# Patient Record
Sex: Male | Born: 1950 | Race: White | Hispanic: No | Marital: Married | Smoking: Former smoker
Health system: Southern US, Community
[De-identification: ages and names within clinical notes are randomized; demographics above are authoritative.]

## PROBLEM LIST (undated history)

## (undated) DIAGNOSIS — M549 Dorsalgia, unspecified: Secondary | ICD-10-CM

## (undated) DIAGNOSIS — I10 Essential (primary) hypertension: Secondary | ICD-10-CM

## (undated) DIAGNOSIS — G4730 Sleep apnea, unspecified: Secondary | ICD-10-CM

## (undated) DIAGNOSIS — K219 Gastro-esophageal reflux disease without esophagitis: Secondary | ICD-10-CM

## (undated) HISTORY — PX: LUMBAR DISC SURGERY: SHX700

## (undated) HISTORY — PX: COLONOSCOPY: SHX174

## (undated) HISTORY — DX: Gastro-esophageal reflux disease without esophagitis: K21.9

## (undated) HISTORY — PX: ROTATOR CUFF REPAIR: SHX139

## (undated) HISTORY — DX: Sleep apnea, unspecified: G47.30

## (undated) HISTORY — PX: WRIST FRACTURE SURGERY: SHX121

## (undated) HISTORY — DX: Dorsalgia, unspecified: M54.9

## (undated) HISTORY — PX: OTHER SURGICAL HISTORY: SHX169

---

## 2015-08-04 ENCOUNTER — Encounter (INDEPENDENT_AMBULATORY_CARE_PROVIDER_SITE_OTHER): Payer: Self-pay | Admitting: *Deleted

## 2015-08-31 ENCOUNTER — Encounter (INDEPENDENT_AMBULATORY_CARE_PROVIDER_SITE_OTHER): Payer: Self-pay

## 2015-08-31 ENCOUNTER — Encounter (INDEPENDENT_AMBULATORY_CARE_PROVIDER_SITE_OTHER): Payer: Self-pay | Admitting: Internal Medicine

## 2015-08-31 ENCOUNTER — Other Ambulatory Visit (INDEPENDENT_AMBULATORY_CARE_PROVIDER_SITE_OTHER): Payer: Self-pay | Admitting: Internal Medicine

## 2015-08-31 ENCOUNTER — Encounter (INDEPENDENT_AMBULATORY_CARE_PROVIDER_SITE_OTHER): Payer: Self-pay | Admitting: *Deleted

## 2015-08-31 ENCOUNTER — Ambulatory Visit (INDEPENDENT_AMBULATORY_CARE_PROVIDER_SITE_OTHER): Payer: BLUE CROSS/BLUE SHIELD | Admitting: Internal Medicine

## 2015-08-31 ENCOUNTER — Telehealth (INDEPENDENT_AMBULATORY_CARE_PROVIDER_SITE_OTHER): Payer: Self-pay | Admitting: Internal Medicine

## 2015-08-31 ENCOUNTER — Other Ambulatory Visit (INDEPENDENT_AMBULATORY_CARE_PROVIDER_SITE_OTHER): Payer: Self-pay | Admitting: *Deleted

## 2015-08-31 VITALS — BP 120/84 | HR 64 | Temp 97.6°F | Ht 68.0 in | Wt 193.6 lb

## 2015-08-31 DIAGNOSIS — K219 Gastro-esophageal reflux disease without esophagitis: Secondary | ICD-10-CM | POA: Diagnosis not present

## 2015-08-31 DIAGNOSIS — M5489 Other dorsalgia: Secondary | ICD-10-CM | POA: Diagnosis not present

## 2015-08-31 DIAGNOSIS — M549 Dorsalgia, unspecified: Secondary | ICD-10-CM | POA: Insufficient documentation

## 2015-08-31 DIAGNOSIS — Z86010 Personal history of colonic polyps: Secondary | ICD-10-CM

## 2015-08-31 DIAGNOSIS — G4730 Sleep apnea, unspecified: Secondary | ICD-10-CM | POA: Diagnosis not present

## 2015-08-31 MED ORDER — ESOMEPRAZOLE MAGNESIUM 40 MG PO CPDR: 40.0000 mg | DELAYED_RELEASE_CAPSULE | Freq: Every day | ORAL | Status: DC

## 2015-08-31 NOTE — Telephone Encounter (Signed)
error 

## 2015-08-31 NOTE — Progress Notes (Addendum)
Subjective:    Patient ID: William Ayers, male    DOB: 09/12/1950, 65 y.o.   MRN: [id number]  HPI Referred by [facility] [place] by Dr. Vicenta Ayers for GERD. He requesting a refill on his Nexium.  He tells me he has GERD. He tells me Dr William Ayers retired. He wants a colonoscopy. His last colonoscopy was 5 yrs ago. He tells me on his 1st colonoscopy he had polyps.  His acid reflux for the most part controlled with the Nexium. Appetite is good. No weight loss. He denies dysphagia. Hx of dysphagia and has undergone multiple EGD/ED's in the past. (see below) Takes Diclofenac BID for chronic back pain.    07/31/2015 total bili 0.5, total protein 6.6, Hep B surface ATGN negative. Hep B Core AB, IGM negative. Hep C AB negative, Hep A AB, IGM negative. AST 29, ALP 79, ALT 34,  10/01/2003 Colonoscopy Dr. Aleene Ayers: Hx of colonic polyp three years ago. Mild diverticulosis sigmoid colon. The sigmoid, descending, transverse, ascending colon and cecum without abnormalities and in specific no recurrent polypoid disease. Cecum well visualized.  04/27/2009 EGD/ED:  Erosive and corrugated esophagus. Lower esophageal ring dilated with a 52 Maloney dilator. Biopsy: Changes of eosinophilic esophagitis are not seen.  02/25/2008 EGD/ED: Lower esophageal ring dilated with a 54 Maloney bougie.  EG junction at 40 cm with a tight lower esophageal ring. No Barrett's or esophageal ring. Good gastric peristalsis.  08/18/2002 EGD/ED: Chronic dysphagia: The esophagus with a hiatus hernia and low esophageal ring. No Barrett's, no reflux changes. Dilated with an 18-20 balloon with minimal bleeding that stopped spontaneously.  Stomach normal except for hiatal hernia. The cardia, fundus, body, antrum, pyloric canal and duodenum bulb area.  07/22/2001 EGD/ED Dr. Aleene Ayers:  Esophagus with a lower esophageal ring above a hiatus hernia. No Barrett's, no actual esophagitis. Dilated with 18, 19, and 2 mm ballon with minimal bleeding  that stopped spontaneously. The remaining stomach was then examined and other than the hiatus hernia was essentially normal with no evidence of ulcer disease or other abnormalities.    Review of Systems Past Medical History  Diagnosis Date  . Sleep apnea   . GERD (gastroesophageal reflux disease)   . Back pain     Past Surgical History  Procedure Laterality Date  . Rotator cuff repair      both shoulders. Left in April 2015, Rt in June 2016.  Marland Kitchen Left inguinal repair       20 yrs or more     No Known Allergies  No current outpatient prescriptions on file prior to visit.   No current facility-administered medications on file prior to visit.  has a current medication list which includes the following prescription(s): vitamin c, diclofenac, esomeprazole, multivitamin, and esomeprazole.  Current Outpatient Prescriptions  Medication Sig Dispense Refill  . Ascorbic Acid (VITAMIN C) 100 MG tablet Take by mouth as needed.    . diclofenac (VOLTAREN) 75 MG EC tablet Take 75 mg by mouth 2 (two) times daily.    Marland Kitchen esomeprazole (NEXIUM) 40 MG capsule Take 40 mg by mouth daily at 12 noon.    . Multiple Vitamin (MULTIVITAMIN) tablet Take 1 tablet by mouth daily.    Marland Kitchen esomeprazole (NEXIUM) 40 MG capsule Take 1 capsule (40 mg total) by mouth daily at 12 noon. 90 capsule 4   No current facility-administered medications for this visit.        Objective:   Physical ExamBlood pressure 120/84, pulse 64, temperature 97.6 F (36.4  C), height  (1.727 m), weight 193 lb 9.6 oz (87.816 kg). Alert and oriented. Skin warm and dry. Oral mucosa is moist.   . Sclera anicteric, conjunctivae is pink. Thyroid not enlarged. No cervical lymphadenopathy. Lungs clear. Heart regular rate and rhythm.  Abdomen is soft. Bowel sounds are positive. No hepatomegaly. No abdominal masses felt. No tenderness.  No edema to lower extremities.          Assessment & Plan:  Hx of colon polyps. Needs surveillance. (Will  get colonoscopy report from Dr. Vangie Ayers office.) GERD controlled at this time with Nexium.  The risks and benefits such as perforation, bleeding, and infection were reviewed with the patient and is agreeable.

## 2015-08-31 NOTE — Patient Instructions (Signed)
Colonoscopy.  The risks and benefits such as perforation, bleeding, and infection were reviewed with the patient and is agreeable. 

## 2015-08-31 NOTE — Telephone Encounter (Signed)
Patient needs trilyte 

## 2015-09-01 MED ORDER — PEG 3350-KCL-NA BICARB-NACL 420 G PO SOLR: 4000.0000 mL | Freq: Once | ORAL | Status: DC

## 2015-09-27 ENCOUNTER — Other Ambulatory Visit (INDEPENDENT_AMBULATORY_CARE_PROVIDER_SITE_OTHER): Payer: Self-pay | Admitting: Internal Medicine

## 2015-09-27 DIAGNOSIS — Z86010 Personal history of colonic polyps: Secondary | ICD-10-CM

## 2015-09-28 ENCOUNTER — Telehealth (INDEPENDENT_AMBULATORY_CARE_PROVIDER_SITE_OTHER): Payer: Self-pay | Admitting: Internal Medicine

## 2015-09-28 MED ORDER — ESOMEPRAZOLE MAGNESIUM 40 MG PO CPDR: 40.0000 mg | DELAYED_RELEASE_CAPSULE | Freq: Every day | ORAL | Status: DC

## 2015-09-28 NOTE — Telephone Encounter (Signed)
Rx sent to his pharmacy

## 2015-09-28 NOTE — Telephone Encounter (Signed)
Mr. Anne HahnWillis called saying he thinks Terri gave him a Rx for Nexium at his last appointment but he can't find it. He actually can't remember if she gave him a Rx or if she was supposed to send it to his pharmacy. Since he doesn't have it, he's wondering [facility] [place]. He believes he's supposed to have a three month supply. He wants the Brand of Nexium, not the generic. Please call the pt if needed.  Pt's ph# [phone] Thank you.

## 2015-09-28 NOTE — Telephone Encounter (Signed)
Please let patient know I sent an Rx to his pharmacy today

## 2015-09-30 ENCOUNTER — Encounter (HOSPITAL_COMMUNITY): Admission: RE | Payer: Self-pay | Source: Ambulatory Visit

## 2015-09-30 ENCOUNTER — Ambulatory Visit (HOSPITAL_COMMUNITY)
Admission: RE | Admit: 2015-09-30 | Payer: BLUE CROSS/BLUE SHIELD | Source: Ambulatory Visit | Admitting: Internal Medicine

## 2015-09-30 SURGERY — COLONOSCOPY
Anesthesia: Moderate Sedation

## 2015-10-06 MED ORDER — SODIUM CHLORIDE 0.9 % IV SOLN: INTRAVENOUS | Status: DC

## 2015-10-07 ENCOUNTER — Ambulatory Visit (HOSPITAL_COMMUNITY)
Admission: RE | Admit: 2015-10-07 | Discharge: 2015-10-07 | Disposition: A | Discharge status: Home / Self Care | Payer: BLUE CROSS/BLUE SHIELD | Source: Ambulatory Visit | Attending: Internal Medicine | Admitting: Internal Medicine

## 2015-10-07 ENCOUNTER — Encounter (HOSPITAL_COMMUNITY)
Admission: RE | Disposition: A | Discharge status: Home / Self Care | Payer: Self-pay | Source: Ambulatory Visit | Attending: Internal Medicine

## 2015-10-07 ENCOUNTER — Encounter (HOSPITAL_COMMUNITY): Payer: Self-pay | Admitting: *Deleted

## 2015-10-07 DIAGNOSIS — Z1211 Encounter for screening for malignant neoplasm of colon: Secondary | ICD-10-CM | POA: Insufficient documentation

## 2015-10-07 DIAGNOSIS — Z87891 Personal history of nicotine dependence: Secondary | ICD-10-CM | POA: Insufficient documentation

## 2015-10-07 DIAGNOSIS — K5730 Diverticulosis of large intestine without perforation or abscess without bleeding: Secondary | ICD-10-CM

## 2015-10-07 DIAGNOSIS — K219 Gastro-esophageal reflux disease without esophagitis: Secondary | ICD-10-CM | POA: Insufficient documentation

## 2015-10-07 DIAGNOSIS — Z791 Long term (current) use of non-steroidal anti-inflammatories (NSAID): Secondary | ICD-10-CM | POA: Diagnosis not present

## 2015-10-07 DIAGNOSIS — Z86010 Personal history of colonic polyps: Secondary | ICD-10-CM | POA: Diagnosis not present

## 2015-10-07 DIAGNOSIS — K644 Residual hemorrhoidal skin tags: Secondary | ICD-10-CM | POA: Insufficient documentation

## 2015-10-07 DIAGNOSIS — Z79899 Other long term (current) drug therapy: Secondary | ICD-10-CM | POA: Insufficient documentation

## 2015-10-07 DIAGNOSIS — G4730 Sleep apnea, unspecified: Secondary | ICD-10-CM | POA: Diagnosis not present

## 2015-10-07 DIAGNOSIS — K648 Other hemorrhoids: Secondary | ICD-10-CM | POA: Diagnosis not present

## 2015-10-07 HISTORY — PX: COLONOSCOPY: SHX5424

## 2015-10-07 LAB — GLUCOSE, CAPILLARY: Glucose-Capillary: 87 mg/dL (ref 65–99)

## 2015-10-07 SURGERY — COLONOSCOPY
Anesthesia: Moderate Sedation

## 2015-10-07 MED ORDER — STERILE WATER FOR IRRIGATION IR SOLN
Status: DC | PRN
  Administered 2015-10-07: 08:00:00

## 2015-10-07 MED ORDER — MIDAZOLAM HCL 5 MG/5ML IJ SOLN
INTRAMUSCULAR | Status: DC | PRN
  Administered 2015-10-07: 1 mg via INTRAVENOUS
  Administered 2015-10-07 (×2): 2 mg via INTRAVENOUS

## 2015-10-07 MED ORDER — SIMETHICONE 40 MG/0.6ML PO SUSP
ORAL | Status: AC
  Filled 2015-10-07: qty 30

## 2015-10-07 MED ORDER — MEPERIDINE HCL 50 MG/ML IJ SOLN
INTRAMUSCULAR | Status: DC | PRN
  Administered 2015-10-07 (×2): 25 mg via INTRAVENOUS

## 2015-10-07 MED ORDER — MIDAZOLAM HCL 5 MG/5ML IJ SOLN
INTRAMUSCULAR | Status: AC
  Filled 2015-10-07: qty 10

## 2015-10-07 MED ORDER — MEPERIDINE HCL 50 MG/ML IJ SOLN
INTRAMUSCULAR | Status: AC
  Filled 2015-10-07: qty 1

## 2015-10-07 MED ORDER — SODIUM CHLORIDE 0.9 % IV SOLN
INTRAVENOUS | Status: DC
  Administered 2015-10-07: 07:00:00 via INTRAVENOUS

## 2015-10-07 NOTE — H&P (Signed)
William FountainSteven Ayers is an 65 y.o. male.   Chief Complaint: Patient is here for colonoscopy. HPI: Patient is 65 year old Caucasian male with history of colonic adenomas but his last exam was in 2005 no polyps were found. He denies abdominal pain change in bowel habits or rectal bleeding. Family history is negative for CRC.  Past Medical History  Diagnosis Date  . Sleep apnea   . GERD (gastroesophageal reflux disease)   . Back pain     Past Surgical History  Procedure Laterality Date  . Rotator cuff repair      both shoulders. Left in April 2015, Rt in June 2016.  Marland Kitchen. Left inguinal repair       20 yrs or more   . Colonoscopy      X 3    History reviewed. No pertinent family history. Social History:  reports that he has quit smoking. His smoking use included Cigarettes. He has a 1 pack-year smoking history. He does not have any smokeless tobacco history on file. He reports that he does not drink alcohol or use illicit drugs.  Allergies: No Known Allergies  Medications Prior to Admission  Medication Sig Dispense Refill  . diclofenac (VOLTAREN) 75 MG EC tablet Take 75 mg by mouth 2 (two) times daily. Reported on 09/20/2015    . esomeprazole (NEXIUM) 40 MG capsule Take 1 capsule (40 mg total) by mouth daily at 12 noon. 90 capsule 3  . fluticasone (FLONASE) 50 MCG/ACT nasal spray Place 2 sprays into both nostrils daily.    . Multiple Vitamin (MULTIVITAMIN) tablet Take 1 tablet by mouth daily.    . polyethylene glycol-electrolytes (NULYTELY/GOLYTELY) 420 g solution Take 4,000 mLs by mouth once. 4000 mL 0  . traMADol (ULTRAM) 50 MG tablet Take 50 mg by mouth daily.    . Ascorbic Acid (VITAMIN C) 100 MG tablet Take by mouth as needed. Reported on 09/20/2015      Results for orders placed or performed during the hospital encounter of 10/07/15 (from the past 48 hour(s))  Glucose, capillary     Status: None   Collection Time: 10/07/15  6:48 AM  Result Value Ref Range   Glucose-Capillary 87 65 - 99  mg/dL   No results found.  ROS  Blood pressure 106/72, pulse 60, temperature 98 F (36.7 C), temperature source Oral, resp. rate 17, height 5\' 7"  (1.702 m), weight 188 lb (85.276 kg), SpO2 93 %. Physical Exam  Constitutional: He appears well-developed and well-nourished.  HENT:  Mouth/Throat: Oropharynx is clear and moist.  Eyes: Conjunctivae are normal.  Neck: No thyromegaly present.  Cardiovascular: Normal rate, regular rhythm and normal heart sounds.   No murmur heard. Respiratory: Effort normal and breath sounds normal.  GI: Soft. He exhibits no distension. There is no tenderness.  Musculoskeletal: He exhibits no edema.  Lymphadenopathy:    He has no cervical adenopathy.  Neurological: He is alert.  Skin: Skin is warm and dry.     Assessment/Plan History of colonic polyps. Surveillance colonoscopy.  Malissa HippoEHMAN,NAJEEB U, MD 10/07/2015, 7:35 AM

## 2015-10-07 NOTE — Discharge Instructions (Signed)
Resume usual medications and high fiber diet. No driving for 24 hours. Can wait 7 years before next colonoscopy.  Colonoscopy, Care After Refer to this sheet in the next few weeks. These instructions provide you with information on caring for yourself after your procedure. Your health care provider may also give you more specific instructions. Your treatment has been planned according to current medical practices, but problems sometimes occur. Call your health care provider if you have any problems or questions after your procedure. WHAT TO EXPECT AFTER THE PROCEDURE  After your procedure, it is typical to have the following:  A small amount of blood in your stool.  Moderate amounts of gas and mild abdominal cramping or bloating. HOME CARE INSTRUCTIONS  Do not drive, operate machinery, or sign important documents for 24 hours.  You may shower and resume your regular physical activities, but move at a slower pace for the first 24 hours.  Take frequent rest periods for the first 24 hours.  Walk around or put a warm pack on your abdomen to help reduce abdominal cramping and bloating.  Drink enough fluids to keep your urine clear or pale yellow.  You may resume your normal diet as instructed by your health care provider. Avoid heavy or fried foods that are hard to digest.  Avoid drinking alcohol for 24 hours or as instructed by your health care provider.  Only take over-the-counter or prescription medicines as directed by your health care provider.  If a tissue sample (biopsy) was taken during your procedure:  Do not take aspirin or blood thinners for 7 days, or as instructed by your health care provider.  Do not drink alcohol for 7 days, or as instructed by your health care provider.  Eat soft foods for the first 24 hours. SEEK MEDICAL CARE IF: You have persistent spotting of blood in your stool 2-3 days after the procedure. SEEK IMMEDIATE MEDICAL CARE IF:  You have more than a  small spotting of blood in your stool.  You pass large blood clots in your stool.  Your abdomen is swollen (distended).  You have nausea or vomiting.  You have a fever.  You have increasing abdominal pain that is not relieved with medicine.   This information is not intended to replace advice given to you by your health care provider. Make sure you discuss any questions you have with your health care provider.   Document Released: 02/15/2004 Document Revised: 04/23/2013 Document Reviewed: 03/10/2013 Elsevier Interactive Patient Education 2016 Elsevier Inc.  High-Fiber Diet Fiber, also called dietary fiber, is a type of carbohydrate found in fruits, vegetables, whole grains, and beans. A high-fiber diet can have many health benefits. Your health care provider may recommend a high-fiber diet to help:  Prevent constipation. Fiber can make your bowel movements more regular.  Lower your cholesterol.  Relieve hemorrhoids, uncomplicated diverticulosis, or irritable bowel syndrome.  Prevent overeating as part of a weight-loss plan.  Prevent heart disease, type 2 diabetes, and certain cancers. WHAT IS MY PLAN? The recommended daily intake of fiber includes:  38 grams for men under age 64.  30 grams for men over age 64.  25 grams for women under age 64.  21 grams for women over age 850. You can get the recommended daily intake of dietary fiber by eating a variety of fruits, vegetables, grains, and beans. Your health care provider may also recommend a fiber supplement if it is not possible to get enough fiber through your diet. WHAT  DO I NEED TO KNOW ABOUT A HIGH-FIBER DIET?  Fiber supplements have not been widely studied for their effectiveness, so it is better to get fiber through food sources.  Always check the fiber content on thenutrition facts label of any prepackaged food. Look for foods that contain at least 5 grams of fiber per serving.  Ask your dietitian if you have  questions about specific foods that are related to your condition, especially if those foods are not listed in the following section.  Increase your daily fiber consumption gradually. Increasing your intake of dietary fiber too quickly may cause bloating, cramping, or gas.  Drink plenty of water. Water helps you to digest fiber. WHAT FOODS CAN I EAT? Grains Whole-grain breads. Multigrain cereal. Oats and oatmeal. Brown rice. Barley. Bulgur wheat. Gibson. Bran muffins. Popcorn. Rye wafer crackers. Vegetables Sweet potatoes. Spinach. Kale. Artichokes. Cabbage. Broccoli. Green peas. Carrots. Squash. Fruits Berries. Pears. Apples. Oranges. Avocados. Prunes and raisins. Dried figs. Meats and Other Protein Sources Navy, kidney, pinto, and soy beans. Split peas. Lentils. Nuts and seeds. Dairy Fiber-fortified yogurt. Beverages Fiber-fortified soy milk. Fiber-fortified orange juice. Other Fiber bars. The items listed above may not be a complete list of recommended foods or beverages. Contact your dietitian for more options. WHAT FOODS ARE NOT RECOMMENDED? Grains White bread. Pasta made with refined flour. White rice. Vegetables Fried potatoes. Canned vegetables. Well-cooked vegetables.  Fruits Fruit juice. Cooked, strained fruit. Meats and Other Protein Sources Fatty cuts of meat. Fried Sales executive or fried fish. Dairy Milk. Yogurt. Cream cheese. Sour cream. Beverages Soft drinks. Other Cakes and pastries. Butter and oils. The items listed above may not be a complete list of foods and beverages to avoid. Contact your dietitian for more information. WHAT ARE SOME TIPS FOR INCLUDING HIGH-FIBER FOODS IN MY DIET?  Eat a wide variety of high-fiber foods.  Make sure that half of all grains consumed each day are whole grains.  Replace breads and cereals made from refined flour or white flour with whole-grain breads and cereals.  Replace white rice with brown rice, bulgur wheat, or  millet.  Start the day with a breakfast that is high in fiber, such as a cereal that contains at least 5 grams of fiber per serving.  Use beans in place of meat in soups, salads, or pasta.  Eat high-fiber snacks, such as berries, raw vegetables, nuts, or popcorn.   This information is not intended to replace advice given to you by your health care provider. Make sure you discuss any questions you have with your health care provider.   Document Released: 07/03/2005 Document Revised: 07/24/2014 Document Reviewed: 12/16/2013 Elsevier Interactive Patient Education [employer].

## 2015-10-07 NOTE — Op Note (Addendum)
[facility] Patient Name: William Ayers Procedure Date: 10/07/2015 7:26 AM MRN: [id number] Date of Birth: 1951/05/01 Attending MD: Lionel December , MD CSN: [id number] Age: 65 Admit Type: Outpatient Procedure:                Colonoscopy Indications:              High risk colon cancer surveillance: Personal                            history of colonic polyps, Last colonoscopy: 2005 Providers:                Lionel December, MD, Cynda Acres, RN, Birder Robson,                            Technician Referring MD:             Dr. Clementeen Hoof. Alba, DO Medicines:                Meperidine 50 mg IV, Midazolam 5 mg IV Complications:            No immediate complications. Estimated Blood Loss:     Estimated blood loss: none. Procedure:                Pre-Anesthesia Assessment:                           - Prior to the procedure, a History and Physical                            was performed, and patient medications and                            allergies were reviewed. The patient's tolerance of                            previous anesthesia was also reviewed. The risks                            and benefits of the procedure and the sedation                            options and risks were discussed with the patient.                            All questions were answered, and informed consent                            was obtained. Prior Anticoagulants: The patient has                            taken no previous anticoagulant or antiplatelet                            agents. ASA Grade Assessment: I - A normal, healthy  patient. After reviewing the risks and benefits,                            the patient was deemed in satisfactory condition to                            undergo the procedure.                           After obtaining informed consent, the colonoscope                            was passed under direct vision. Throughout the           procedure, the patient's blood pressure, pulse, and                            oxygen saturations were monitored continuously. The                            [id number] ([id number] scope was introduced through                            the anus and advanced to the the cecum, identified                            by appendiceal orifice and ileocecal valve. The                            colonoscopy was performed without difficulty. The                            patient tolerated the procedure well. The quality                            of the bowel preparation was excellent. The                            ileocecal valve, appendiceal orifice, and rectum                            were photographed. Scope In: 7:45:36 AM Scope Out: 7:58:59 AM Scope Withdrawal Time: 0 hours 6 minutes 13 seconds  Total Procedure Duration: 0 hours 13 minutes 23 seconds  Findings:      A few small-mouthed diverticula were found in the sigmoid colon.      Non-bleeding external hemorrhoids were found during retroflexion. The       hemorrhoids were small.      The exam was otherwise without abnormality. Impression:               - Diverticulosis in the sigmoid colon.                           - Non-bleeding external hemorrhoids.                           -  The examination was otherwise normal.                           - No specimens collected. Moderate Sedation:      Moderate (conscious) sedation was administered by the endoscopy nurse       and supervised by the endoscopist. The following parameters were       monitored: oxygen saturation, heart rate, blood pressure, CO2       capnography and response to care. Total physician intraservice time was       21 minutes. Recommendation:           - Patient has a contact number available for                            emergencies. The signs and symptoms of potential                            delayed complications were discussed with the                             patient. Return to normal activities tomorrow.                            Written discharge instructions were provided to the                            patient.                           -                           - High fiber diet today.                           - Continue present medications.                           - Repeat colonoscopy in 7 years for surveillance. Procedure Code(s):        --- Professional ---                           [phone], Colonoscopy, flexible; diagnostic, including                            collection of specimen(s) by brushing or washing,                            when performed (separate procedure)                           G0500, Moderate sedation services provided by the                            same physician or other qualified health care  professional performing a gastrointestinal                            endoscopic service that sedation supports,                            requiring the presence of an independent trained                            observer to assist in the monitoring of the                            patient's level of consciousness and physiological                            status; initial 15 minutes of intra-service time;                            patient age 47 years or older (additional time may                            be reported with 11914, as appropriate) Diagnosis Code(s):        --- Professional ---                           Z86.010, Personal history of colonic polyps                           K64.4, Residual hemorrhoidal skin tags                           K57.30, Diverticulosis of large intestine without                            perforation or abscess without bleeding CPT copyright 2016 American Medical Association. All rights reserved. The codes documented in this report are preliminary and upon coder review may  be revised to meet current compliance requirements. Lionel December, MD Lionel December, MD 10/07/2015 8:12:18 AM This report has been signed electronically. Number of Addenda: 0

## 2015-10-13 ENCOUNTER — Encounter (HOSPITAL_COMMUNITY): Payer: Self-pay | Admitting: Internal Medicine

## 2015-12-01 ENCOUNTER — Telehealth (INDEPENDENT_AMBULATORY_CARE_PROVIDER_SITE_OTHER): Payer: Self-pay | Admitting: Internal Medicine

## 2015-12-01 DIAGNOSIS — K219 Gastro-esophageal reflux disease without esophagitis: Secondary | ICD-10-CM

## 2015-12-01 NOTE — Telephone Encounter (Signed)
Nexium refilled for patient

## 2015-12-20 ENCOUNTER — Other Ambulatory Visit: Payer: Self-pay | Admitting: Orthopedic Surgery

## 2015-12-28 ENCOUNTER — Encounter (HOSPITAL_BASED_OUTPATIENT_CLINIC_OR_DEPARTMENT_OTHER): Payer: Self-pay | Admitting: *Deleted
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
## 2016-01-03 ENCOUNTER — Encounter (HOSPITAL_BASED_OUTPATIENT_CLINIC_OR_DEPARTMENT_OTHER)
Admission: RE | Disposition: A | Discharge status: Home / Self Care | Payer: Self-pay | Source: Ambulatory Visit | Attending: Orthopedic Surgery

## 2016-01-03 ENCOUNTER — Ambulatory Visit (HOSPITAL_COMMUNITY): Payer: BLUE CROSS/BLUE SHIELD

## 2016-01-03 ENCOUNTER — Ambulatory Visit (HOSPITAL_BASED_OUTPATIENT_CLINIC_OR_DEPARTMENT_OTHER): Payer: BLUE CROSS/BLUE SHIELD | Admitting: Anesthesiology

## 2016-01-03 ENCOUNTER — Ambulatory Visit (HOSPITAL_BASED_OUTPATIENT_CLINIC_OR_DEPARTMENT_OTHER)
Admission: RE | Admit: 2016-01-03 | Discharge: 2016-01-03 | Disposition: A | Discharge status: Home / Self Care | Payer: BLUE CROSS/BLUE SHIELD | Source: Ambulatory Visit | Attending: Orthopedic Surgery | Admitting: Orthopedic Surgery

## 2016-01-03 ENCOUNTER — Encounter (HOSPITAL_BASED_OUTPATIENT_CLINIC_OR_DEPARTMENT_OTHER): Payer: Self-pay

## 2016-01-03 DIAGNOSIS — S62012A Displaced fracture of distal pole of navicular [scaphoid] bone of left wrist, initial encounter for closed fracture: Secondary | ICD-10-CM | POA: Insufficient documentation

## 2016-01-03 DIAGNOSIS — S62002K Unspecified fracture of navicular [scaphoid] bone of left wrist, subsequent encounter for fracture with nonunion: Secondary | ICD-10-CM

## 2016-01-03 DIAGNOSIS — Z87891 Personal history of nicotine dependence: Secondary | ICD-10-CM | POA: Diagnosis not present

## 2016-01-03 DIAGNOSIS — X58XXXA Exposure to other specified factors, initial encounter: Secondary | ICD-10-CM | POA: Insufficient documentation

## 2016-01-03 DIAGNOSIS — S62002S Unspecified fracture of navicular [scaphoid] bone of left wrist, sequela: Secondary | ICD-10-CM

## 2016-01-03 DIAGNOSIS — Z4789 Encounter for other orthopedic aftercare: Secondary | ICD-10-CM

## 2016-01-03 HISTORY — PX: OPEN REDUCTION INTERNAL FIXATION (ORIF) SCAPHOID WITH ILIAC CREST BONE GRAFT: SHX5668

## 2016-01-03 SURGERY — OPEN REDUCTION INTERNAL FIXATION (ORIF) SCAPHOID WITH ILIAC CREST BONE GRAFT
Anesthesia: Regional | Site: Wrist | Laterality: Left

## 2016-01-03 MED ORDER — SCOPOLAMINE 1 MG/3DAYS TD PT72: 1.0000 | MEDICATED_PATCH | Freq: Once | TRANSDERMAL | Status: DC | PRN

## 2016-01-03 MED ORDER — MEPERIDINE HCL 25 MG/ML IJ SOLN: 6.2500 mg | INTRAMUSCULAR | Status: DC | PRN

## 2016-01-03 MED ORDER — ONDANSETRON HCL 4 MG PO TABS: 4.0000 mg | ORAL_TABLET | Freq: Three times a day (TID) | ORAL | Status: DC | PRN

## 2016-01-03 MED ORDER — LACTATED RINGERS IV SOLN
INTRAVENOUS | Status: DC
  Administered 2016-01-03: 09:00:00 via INTRAVENOUS

## 2016-01-03 MED ORDER — PROPOFOL 500 MG/50ML IV EMUL
INTRAVENOUS | Status: DC | PRN
  Administered 2016-01-03: 150 ug/kg/min via INTRAVENOUS

## 2016-01-03 MED ORDER — LIDOCAINE HCL (PF) 1 % IJ SOLN
INTRAMUSCULAR | Status: DC | PRN
  Administered 2016-01-03: 8 mL

## 2016-01-03 MED ORDER — HYDROMORPHONE HCL 2 MG PO TABS: 2.0000 mg | ORAL_TABLET | ORAL | Status: DC | PRN

## 2016-01-03 MED ORDER — LACTATED RINGERS IV SOLN
INTRAVENOUS | Status: DC
  Administered 2016-01-03: 08:00:00 via INTRAVENOUS

## 2016-01-03 MED ORDER — HYDROMORPHONE HCL 1 MG/ML IJ SOLN
0.2500 mg | INTRAMUSCULAR | Status: DC | PRN
Start: 2016-01-03 — End: 2016-01-03

## 2016-01-03 MED ORDER — LIDOCAINE HCL (PF) 1 % IJ SOLN
INTRAMUSCULAR | Status: AC
  Filled 2016-01-03: qty 30

## 2016-01-03 MED ORDER — BUPIVACAINE-EPINEPHRINE (PF) 0.5% -1:200000 IJ SOLN
INTRAMUSCULAR | Status: DC | PRN
  Administered 2016-01-03: 30 mL via PERINEURAL

## 2016-01-03 MED ORDER — ONDANSETRON HCL 4 MG/2ML IJ SOLN: 4.0000 mg | Freq: Once | INTRAMUSCULAR | Status: DC | PRN

## 2016-01-03 MED ORDER — 0.9 % SODIUM CHLORIDE (POUR BTL) OPTIME
TOPICAL | Status: DC | PRN
  Administered 2016-01-03: 300 mL

## 2016-01-03 MED ORDER — OXYCODONE-ACETAMINOPHEN 5-325 MG PO TABS: 1.0000 | ORAL_TABLET | ORAL | Status: DC | PRN

## 2016-01-03 MED ORDER — BUPIVACAINE-EPINEPHRINE 0.5% -1:200000 IJ SOLN
INTRAMUSCULAR | Status: DC | PRN
  Administered 2016-01-03: 8 mL

## 2016-01-03 MED ORDER — MIDAZOLAM HCL 2 MG/2ML IJ SOLN
INTRAMUSCULAR | Status: AC
  Filled 2016-01-03: qty 2

## 2016-01-03 MED ORDER — FENTANYL CITRATE (PF) 100 MCG/2ML IJ SOLN
INTRAMUSCULAR | Status: AC
  Filled 2016-01-03: qty 2

## 2016-01-03 MED ORDER — CEFAZOLIN SODIUM-DEXTROSE 2-4 GM/100ML-% IV SOLN
INTRAVENOUS | Status: AC
  Filled 2016-01-03: qty 100

## 2016-01-03 MED ORDER — MIDAZOLAM HCL 2 MG/2ML IJ SOLN
1.0000 mg | INTRAMUSCULAR | Status: DC | PRN
  Administered 2016-01-03: 2 mg via INTRAVENOUS

## 2016-01-03 MED ORDER — LIDOCAINE HCL (CARDIAC) 20 MG/ML IV SOLN
INTRAVENOUS | Status: DC | PRN
  Administered 2016-01-03: 30 mg via INTRAVENOUS

## 2016-01-03 MED ORDER — CEFAZOLIN SODIUM-DEXTROSE 2-4 GM/100ML-% IV SOLN
2.0000 g | INTRAVENOUS | Status: AC
  Administered 2016-01-03: 2 g via INTRAVENOUS

## 2016-01-03 MED ORDER — GLYCOPYRROLATE 0.2 MG/ML IJ SOLN: 0.2000 mg | Freq: Once | INTRAMUSCULAR | Status: DC | PRN

## 2016-01-03 MED ORDER — PROPOFOL 10 MG/ML IV BOLUS
INTRAVENOUS | Status: AC
  Filled 2016-01-03: qty 20

## 2016-01-03 MED ORDER — ONDANSETRON HCL 4 MG/2ML IJ SOLN
INTRAMUSCULAR | Status: AC
  Filled 2016-01-03: qty 2

## 2016-01-03 MED ORDER — FENTANYL CITRATE (PF) 100 MCG/2ML IJ SOLN
50.0000 ug | INTRAMUSCULAR | Status: DC | PRN
  Administered 2016-01-03: 100 ug via INTRAVENOUS

## 2016-01-03 MED ORDER — ONDANSETRON HCL 4 MG/2ML IJ SOLN
INTRAMUSCULAR | Status: DC | PRN
  Administered 2016-01-03: 4 mg via INTRAVENOUS

## 2016-01-03 SURGICAL SUPPLY — 69 items
BANDAGE COBAN STERILE 2 (GAUZE/BANDAGES/DRESSINGS) IMPLANT
BENZOIN TINCTURE PRP APPL 2/3 (GAUZE/BANDAGES/DRESSINGS) ×2 IMPLANT
BIT DRILL MINI LNG ACUTRAK 2 (BIT) ×1 IMPLANT
BLADE MINI RND TIP GREEN BEAV (BLADE) IMPLANT
BLADE SURG 15 STRL LF DISP TIS (BLADE) ×1 IMPLANT
BLADE SURG 15 STRL SS (BLADE) ×1
BNDG COHESIVE 4X5 TAN STRL (GAUZE/BANDAGES/DRESSINGS) ×2 IMPLANT
BNDG ESMARK 4X9 LF (GAUZE/BANDAGES/DRESSINGS) ×2 IMPLANT
BNDG GAUZE ELAST 4 BULKY (GAUZE/BANDAGES/DRESSINGS) ×2 IMPLANT
BRUSH SCRUB EZ PLAIN DRY (MISCELLANEOUS) IMPLANT
CANISTER SUCT 1200ML W/VALVE (MISCELLANEOUS) ×2 IMPLANT
CHLORAPREP W/TINT 26ML (MISCELLANEOUS) ×4 IMPLANT
CORDS BIPOLAR (ELECTRODE) ×2 IMPLANT
COVER BACK TABLE 60X90IN (DRAPES) ×2 IMPLANT
COVER MAYO STAND STRL (DRAPES) ×2 IMPLANT
CUFF TOURNIQUET SINGLE 18IN (TOURNIQUET CUFF) ×2 IMPLANT
CUFF TOURNIQUET SINGLE 24IN (TOURNIQUET CUFF) IMPLANT
DRAPE C-ARM 42X72 X-RAY (DRAPES) ×2 IMPLANT
DRAPE EXTREMITY T 121X128X90 (DRAPE) ×2 IMPLANT
DRAPE INCISE IOBAN 66X45 STRL (DRAPES) ×2 IMPLANT
DRAPE SURG 17X23 STRL (DRAPES) ×2 IMPLANT
DRILL MINI LNG ACUTRAK 2 (BIT) ×2
DRSG ADAPTIC 3X8 NADH LF (GAUZE/BANDAGES/DRESSINGS) ×2 IMPLANT
DRSG EMULSION OIL 3X3 NADH (GAUZE/BANDAGES/DRESSINGS) ×2 IMPLANT
ELECT REM PT RETURN 9FT ADLT (ELECTROSURGICAL) ×2
ELECTRODE REM PT RTRN 9FT ADLT (ELECTROSURGICAL) ×1 IMPLANT
GAUZE SPONGE 4X4 12PLY STRL (GAUZE/BANDAGES/DRESSINGS) ×2 IMPLANT
GLOVE BIO SURGEON STRL SZ7.5 (GLOVE) ×2 IMPLANT
GLOVE BIOGEL PI IND STRL 7.0 (GLOVE) ×1 IMPLANT
GLOVE BIOGEL PI IND STRL 8 (GLOVE) ×1 IMPLANT
GLOVE BIOGEL PI INDICATOR 7.0 (GLOVE) ×1
GLOVE BIOGEL PI INDICATOR 8 (GLOVE) ×1
GLOVE ECLIPSE 6.5 STRL STRAW (GLOVE) ×2 IMPLANT
GOWN STRL REUS W/TWL XL LVL3 (GOWN DISPOSABLE) ×2 IMPLANT
GUIDEWIRE ORTHO MINI ACTK .045 (WIRE) ×4 IMPLANT
NEEDLE 16GX1 1/2 (NEEDLE) IMPLANT
NEEDLE HYPO 25X1 1.5 SAFETY (NEEDLE) ×2 IMPLANT
NS IRRIG 1000ML POUR BTL (IV SOLUTION) ×2 IMPLANT
PACK BASIN DAY SURGERY FS (CUSTOM PROCEDURE TRAY) ×2 IMPLANT
PADDING CAST ABS 4INX4YD NS (CAST SUPPLIES)
PADDING CAST ABS COTTON 4X4 ST (CAST SUPPLIES) IMPLANT
PENCIL BUTTON HOLSTER BLD 10FT (ELECTRODE) ×2 IMPLANT
RUBBERBAND STERILE (MISCELLANEOUS) IMPLANT
SCREW ACUTRAK 2 MINI 24MM (Screw) ×2 IMPLANT
SLEEVE SCD COMPRESS KNEE MED (MISCELLANEOUS) ×2 IMPLANT
SPLINT FAST PLASTER 5X30 (CAST SUPPLIES) ×1
SPLINT PLASTER CAST FAST 5X30 (CAST SUPPLIES) ×1 IMPLANT
SPONGE LAP 4X18 X RAY DECT (DISPOSABLE) ×2 IMPLANT
SPONGE SURGIFOAM ABS GEL 12-7 (HEMOSTASIS) ×2 IMPLANT
STOCKINETTE 6  STRL (DRAPES) ×1
STOCKINETTE 6 STRL (DRAPES) ×1 IMPLANT
SUCTION FRAZIER HANDLE 10FR (MISCELLANEOUS) ×1
SUCTION TUBE FRAZIER 10FR DISP (MISCELLANEOUS) ×1 IMPLANT
SUT VIC AB 0 CT1 27 (SUTURE)
SUT VIC AB 0 CT1 27XBRD ANBCTR (SUTURE) IMPLANT
SUT VIC AB 0 CT3 27 (SUTURE) ×2 IMPLANT
SUT VIC AB 2-0 CT3 27 (SUTURE) ×2 IMPLANT
SUT VIC AB 2-0 SH 27 (SUTURE)
SUT VIC AB 2-0 SH 27XBRD (SUTURE) IMPLANT
SUT VIC AB 3-0 FS2 27 (SUTURE) ×2 IMPLANT
SUT VICRYL 4-0 PS2 18IN ABS (SUTURE) IMPLANT
SUT VICRYL RAPIDE 4-0 (SUTURE) ×2 IMPLANT
SUT VICRYL RAPIDE 4/0 PS 2 (SUTURE) ×2 IMPLANT
SYR BULB 3OZ (MISCELLANEOUS) ×2 IMPLANT
SYRINGE 10CC LL (SYRINGE) ×2 IMPLANT
TOWEL OR 17X24 6PK STRL BLUE (TOWEL DISPOSABLE) ×2 IMPLANT
TOWEL OR NON WOVEN STRL DISP B (DISPOSABLE) ×2 IMPLANT
TUBE CONNECTING 20X1/4 (TUBING) ×2 IMPLANT
UNDERPAD 30X30 (UNDERPADS AND DIAPERS) ×2 IMPLANT

## 2016-01-03 NOTE — Discharge Instructions (Signed)
Discharge Instructions ° ° °You have a dressing with a plaster splint incorporated in it. °Move your fingers as much as possible, making a full fist and fully opening the fist. °Elevate your hand to reduce pain & swelling of the digits.  Ice over the operative site may be helpful to reduce pain & swelling.  DO NOT USE HEAT. °Pain medicine has been prescribed for you.  °Use your medicine as needed over the first 48 hours, and then you can begin to taper your use.  You may use Tylenol in place of your prescribed pain medication, but not IN ADDITION to it. °Leave the dressing in place until you return to our office.  °You may shower, but keep the bandage clean & dry.  °You may drive a car when you are off of prescription pain medications and can safely control your vehicle with both hands. °Our office will call you to arrange follow-up ° ° °Please call [phone] during normal business hours or [phone] after hours for any problems. Including the following: ° °- excessive redness of the incisions °- drainage for more than 4 days °- fever of more than 101.5 F ° °*Please note that pain medications will not be refilled after hours or on weekends. ° ° °Regional Anesthesia Blocks ° °1. Numbness or the inability to move the "blocked" extremity may last from 3-48 hours after placement. The length of time depends on the medication injected and your individual response to the medication. If the numbness is not going away after 48 hours, call your surgeon. ° °2. The extremity that is blocked will need to be protected until the numbness is gone and the  Strength has returned. Because you cannot feel it, you will need to take extra care to avoid injury. Because it may be weak, you may have difficulty moving it or using it. You may not know what position it is in without looking at it while the block is in effect. ° °3. For blocks in the legs and feet, returning to weight bearing and walking needs to be done carefully. You  will need to wait until the numbness is entirely gone and the strength has returned. You should be able to move your leg and foot normally before you try and bear weight or walk. You will need someone to be with you when you first try to ensure you do not fall and possibly risk injury. ° °4. Bruising and tenderness at the needle site are common side effects and will resolve in a few days. ° °5. Persistent numbness or new problems with movement should be communicated to the surgeon or the [facility] ([phone])/ [facility] ([phone]). ° °Post Anesthesia Home Care Instructions ° °Activity: °Get plenty of rest for the remainder of the day. A responsible adult should stay with you for 24 hours following the procedure.  °For the next 24 hours, DO NOT: °-Drive a car °-Operate machinery °-Drink alcoholic beverages °-Take any medication unless instructed by your physician °-Make any legal decisions or sign important papers. ° °Meals: °Start with liquid foods such as gelatin or soup. Progress to regular foods as tolerated. Avoid greasy, spicy, heavy foods. If nausea and/or vomiting occur, drink only clear liquids until the nausea and/or vomiting subsides. Call your physician if vomiting continues. ° °Special Instructions/Symptoms: °Your throat may feel dry or sore from the anesthesia or the breathing tube placed in your throat during surgery. If this causes discomfort, gargle with warm salt water.   The discomfort should disappear within 24 hours. ° °If you had a scopolamine patch placed behind your ear for the management of post- operative nausea and/or vomiting: ° °1. The medication in the patch is effective for 72 hours, after which it should be removed.  Wrap patch in a tissue and discard in the trash. Wash hands thoroughly with soap and water. °2. You may remove the patch earlier than 72 hours if you experience unpleasant side effects which may include dry mouth, dizziness or visual  disturbances. °3. Avoid touching the patch. Wash your hands with soap and water after contact with the patch. °  ° °

## 2016-01-03 NOTE — Anesthesia Procedure Notes (Addendum)
Anesthesia Regional Block:  Supraclavicular block  Pre-Anesthetic Checklist: ,, timeout performed, Correct Patient, Correct Site, Correct Laterality, Correct Procedure, Correct Position, site marked, Risks and benefits discussed,  Surgical consent,  Pre-op evaluation,  At surgeon's request and post-op pain management  Laterality: Left  Prep: chloraprep       Needles:   Needle Type: Echogenic Stimulator Needle     Needle Length: 9cm 9 cm Needle Gauge: 21 and 21 G    Additional Needles:  Procedures: ultrasound guided (picture in chart) and nerve stimulator Supraclavicular block  Nerve Stimulator or Paresthesia:  Response: 0.4 mA,   Additional Responses:   Narrative:  Start time: 01/03/2016 8:40 AM End time: 01/03/2016 8:50 AM Injection made incrementally with aspirations every 5 mL.  Performed by: Personally  Anesthesiologist: Arta BruceSSEY, KEVIN  Additional Notes: Monitors applied. Patient sedated. Sterile prep and drape,hand hygiene and sterile gloves were used. Relevant anatomy identified.Needle position confirmed.Local anesthetic injected incrementally after negative aspiration. Local anesthetic spread visualized around nerve(s). Vascular puncture avoided. No complications. Image printed for medical record.The patient tolerated the procedure well.        Procedure Name: MAC Date/Time: 01/03/2016 9:30 AM Performed by: Cleda ClarksBROWDER, Annye Forrey R Pre-anesthesia Checklist: Patient identified, Emergency Drugs available, Suction available, Patient being monitored and Timeout performed Patient Re-evaluated:Patient Re-evaluated prior to inductionOxygen Delivery Method: Simple face mask

## 2016-01-03 NOTE — Op Note (Signed)
01/03/2016  8:17 AM  PATIENT:  William Ayers  65 y.o. male  PRE-OPERATIVE DIAGNOSIS:  Left scaphoid nonunion  POST-OPERATIVE DIAGNOSIS:  Same  PROCEDURE:  Open treatment of left scaphoid nonunion with iliac crest bone grafting  SURGEON: Cliffton Astersavid A. Janee Mornhompson, MD  PHYSICIAN ASSISTANT: Danielle RankinKirsten Schrader, OPA-C  ANESTHESIA:  local and regional  SPECIMENS:  None  DRAINS:   None  EBL:  less than 50 mL  PREOPERATIVE INDICATIONS:  William Ayers is a  65 y.o. male with left scaphoid nonunion.  The risks benefits and alternatives were discussed with the patient preoperatively including but not limited to the risks of infection, bleeding, nerve injury, cardiopulmonary complications, the need for revision surgery, among others, and the patient verbalized understanding and consented to proceed.  OPERATIVE IMPLANTS: Mini Acutrak screw 1, 22 mm length  OPERATIVE PROCEDURE:  After receiving prophylactic antibiotics and a regional block, the patient was escorted to the operative theatre and placed in a supine position.  A surgical "time-out" was performed during which the planned procedure, proposed operative site, and the correct patient identity were compared to the operative consent and agreement confirmed by the circulating nurse according to current facility policy.  Following application of a tourniquet to the operative extremity, the exposed skin was prepped with Chloraprep and draped in the usual sterile fashion.  Additionally, the site for potential iliac crest bone graft was prepped and the drapes cut over it, covered with Ioban.  The limb was exsanguinated with an Esmarch bandage and the tourniquet inflated to approximately 100mmHg higher than systolic BP.  A volar approach was made to the scaphoid, incising the skin and a hockey stick fashion, subcutaneous tissues with blunt spreading dissection.  The floor of the FCR sheath was exploited.  The volar capsule over the scaphoid and portions of  the trapezium was also incised down the midline of the scaphoid and reflected.  A gentle styloidectomy was performed.  The nonunion site was identified, found to have free motion, and the sites debrided with curettes, and the metaphyseal bone on either side gouged with a K wire that was rotating.  A dorsal K wire was then driven through the radius percutaneously into the lunate was brought out of dorsiflexion.  With the humpback deformity thus taken out of the scaphoid, proximal pole was secured to the capitate temporarily, and the tourniquet released.  The wound was temporarily closed as bone graft was harvested.  The site of bone graft harvest skin incision was anesthetized with local anesthetic and then the skin pulled up over the crest.  Bovie much cautery was carried down to the edge of the crest as the top surface was thus exposed.  Osteotomes were used to take the cortical cancellus junk and some additional cancellous bone was taken with a curette.  The wound was irrigated, telephone packed into the defect, and the deep fascia closed over the crest.  Subcutaneous tissues after irrigating were closed with 3-0 Vicryl subcuticular sutures that were interrupted, and ultimately followed by benzoin and Steri-Strips.  Attention was shifted back to the wrist, where again the tourniquet was inflated.  The bone graft was packed into the defect, first with cancellous graft packing the defect tightly and then with a cortical piece reestablishing volar cortical continuity.  Some of the trapezium was nibbled away to allow for better starting point for the Acutrak screw, and the guidepin was placed under fluoroscopic guidance area it was measured to be at 30, so 22 mm screw was  selected.  The path of the screw was overdrilled with the proper cannulated drill and then the screw was placed.  An effort was made to place a screw down the central axis of the scaphoid as best could be done.  Compression was nice, capturing the  cortical piece snugly.  Final images were obtained after the other 2 provisional K wires have been removed, this allowed for better positioning of the wrist for scaphoid views.  The screw was found to be completely within the confines of the scaphoid radiographically.  The wound was irrigated, the capsule closed with 3-0 Vicryl suture, including reapproximation of the radioscaphocapitate ligament.  The floor of the sheath was reapproximated with the same suture followed by running 4-0 Vicryl Rapide horizontal mattress sutures in the skin.  A short arm him spica splint dressing was applied as a dressing was also applied to the iliac crest harvest site.  He was taken to the recovery room in stable condition.  DISPOSITION: He'll be discharged home today with typical instructions, returning in 10-15 days, at which time he should have new x-rays of the left wrist out of the splint to include a scaphoid view and transition to a short arm versus long-arm thumb spica cast with a window for the ultrasound bone stimulator.

## 2016-01-03 NOTE — Interval H&P Note (Signed)
History and Physical Interval Note:  01/03/2016 8:17 AM  William FountainSteven Ayers  has presented today for surgery, with the diagnosis of LEFT SCAPHOID NON UNION S62.022K  The various methods of treatment have been discussed with the patient and family. After consideration of risks, benefits and other options for treatment, the patient has consented to  Procedure(s) with comments: OPEN TREATMENT OF LEFT  SCAPHOID NONUNION WITH POSSIBLE ILIAC CREST BONE GRAFT (Left) - GENERAL ANESTHESIA WITH PRE-OP BLOCK as a surgical intervention .  The patient's history has been reviewed, patient examined, no change in status, stable for surgery.  I have reviewed the patient's chart and labs.  Questions were answered to the patient's satisfaction.     [name] A.

## 2016-01-03 NOTE — Anesthesia Preprocedure Evaluation (Signed)
Anesthesia Evaluation  Patient identified by MRN, date of birth, ID band Patient awake    Reviewed: Allergy & Precautions, NPO status , Patient's Chart, lab work & pertinent test results  Airway Mallampati: I  TM Distance: >3 FB Neck ROM: Full    Dental   Pulmonary former smoker,    Pulmonary exam normal        Cardiovascular Normal cardiovascular exam     Neuro/Psych    GI/Hepatic GERD  Medicated and Controlled,  Endo/Other    Renal/GU      Musculoskeletal   Abdominal   Peds  Hematology   Anesthesia Other Findings   Reproductive/Obstetrics                             Anesthesia Physical Anesthesia Plan  ASA: II  Anesthesia Plan: Regional   Post-op Pain Management:    Induction: Intravenous  Airway Management Planned: Simple Face Mask  Additional Equipment:   Intra-op Plan:   Post-operative Plan:   Informed Consent: I have reviewed the patients History and Physical, chart, labs and discussed the procedure including the risks, benefits and alternatives for the proposed anesthesia with the patient or authorized representative who has indicated his/her understanding and acceptance.     Plan Discussed with: CRNA and Surgeon  Anesthesia Plan Comments:         Anesthesia Quick Evaluation

## 2016-01-03 NOTE — Transfer of Care (Signed)
Immediate Anesthesia Transfer of Care Note  Patient: Trish FountainSteven Plack  Procedure(s) Performed: Procedure(s) with comments: OPEN TREATMENT OF LEFT  SCAPHOID NONUNION WITH ILIAC CREST BONE GRAFT (Left) - GENERAL ANESTHESIA WITH PRE-OP BLOCK  Patient Location: PACU  Anesthesia Type:MAC  Level of Consciousness: awake, alert  and oriented  Airway & Oxygen Therapy: Patient Spontanous Breathing and Patient connected to nasal cannula oxygen  Post-op Assessment: Report given to RN and Post -op Vital signs reviewed and stable  Post vital signs: Reviewed and stable  Last Vitals:  Filed Vitals:   01/03/16 0858 01/03/16 1130  BP:    Pulse: 59 55  Temp:    Resp: 12     Last Pain: There were no vitals filed for this visit.       Complications: No apparent anesthesia complications

## 2016-01-03 NOTE — Progress Notes (Signed)
Assisted Dr. Ossey with left, ultrasound guided, supraclavicular block. Side rails up, monitors on throughout procedure. See vital signs in flow sheet. Tolerated Procedure well. 

## 2016-01-03 NOTE — Anesthesia Postprocedure Evaluation (Signed)
Anesthesia Post Note  Patient: Trish FountainSteven Fulginiti  Procedure(s) Performed: Procedure(s) (LRB): OPEN TREATMENT OF LEFT  SCAPHOID NONUNION WITH ILIAC CREST BONE GRAFT (Left)  Patient location during evaluation: PACU Anesthesia Type: Regional Level of consciousness: awake and alert and patient cooperative Pain management: pain level controlled Vital Signs Assessment: post-procedure vital signs reviewed and stable Respiratory status: spontaneous breathing and respiratory function stable Cardiovascular status: stable Anesthetic complications: no    Last Vitals:  Filed Vitals:   01/03/16 1156 01/03/16 1207  BP:  117/86  Pulse: 51 58  Temp:  36.5 C  Resp: 13 16    Last Pain:  Filed Vitals:   01/03/16 1211  PainSc: 0-No pain                 Abdifatah Colquhoun DAVID

## 2016-01-04 ENCOUNTER — Encounter (HOSPITAL_BASED_OUTPATIENT_CLINIC_OR_DEPARTMENT_OTHER): Payer: Self-pay | Admitting: Orthopedic Surgery

## 2016-03-28 ENCOUNTER — Encounter (INDEPENDENT_AMBULATORY_CARE_PROVIDER_SITE_OTHER): Payer: Self-pay

## 2017-02-06 ENCOUNTER — Other Ambulatory Visit (INDEPENDENT_AMBULATORY_CARE_PROVIDER_SITE_OTHER): Payer: Self-pay | Admitting: Internal Medicine

## 2017-12-10 IMAGING — CR DG WRIST COMPLETE 3+V*L*
4 series · 4 of 4 positions shown · non-contrast
Comparison: None in PACs

CLINICAL DATA: Preoperative examination prior to surgery for
navicular fracture.

EXAM:
LEFT WRIST - COMPLETE 3+ VIEW

[PA (1 of 3)]
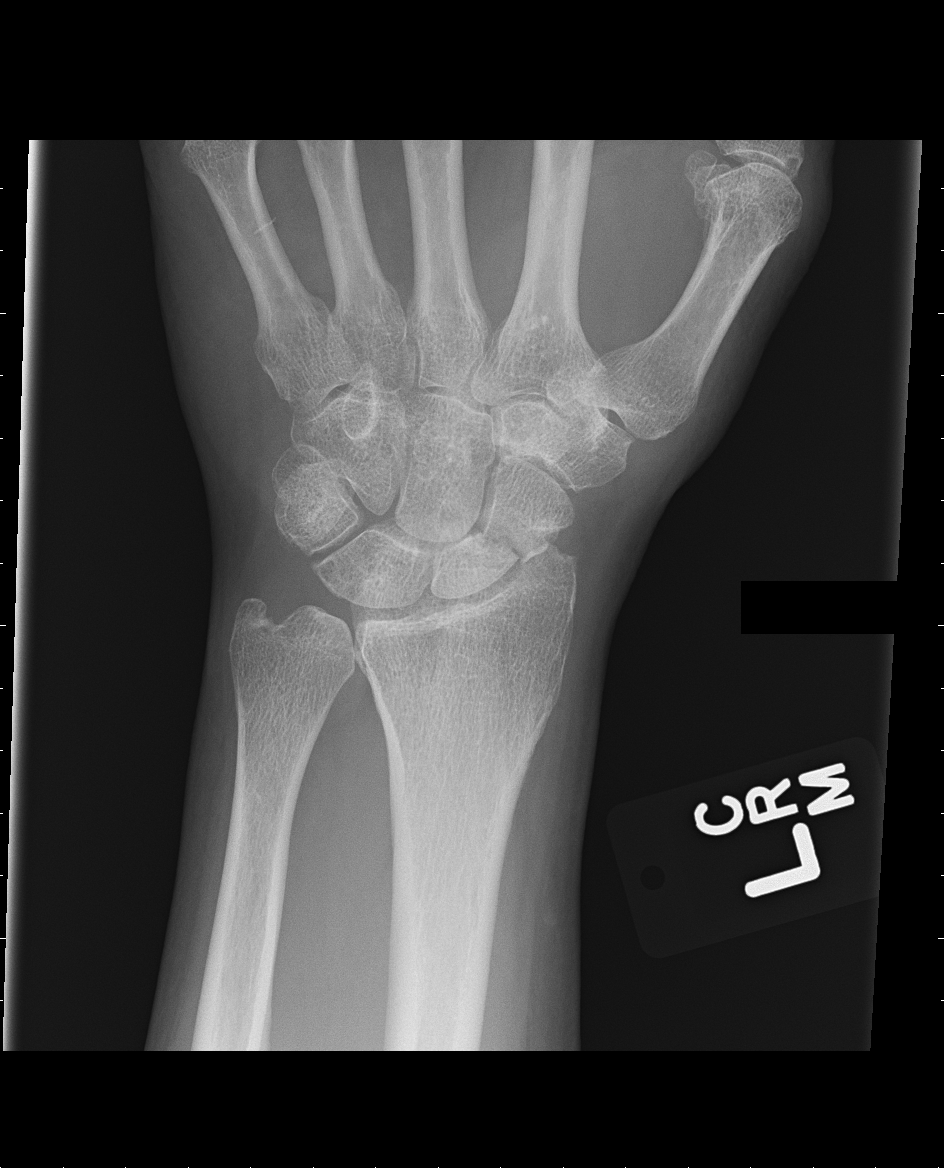

[PA (2 of 3)]
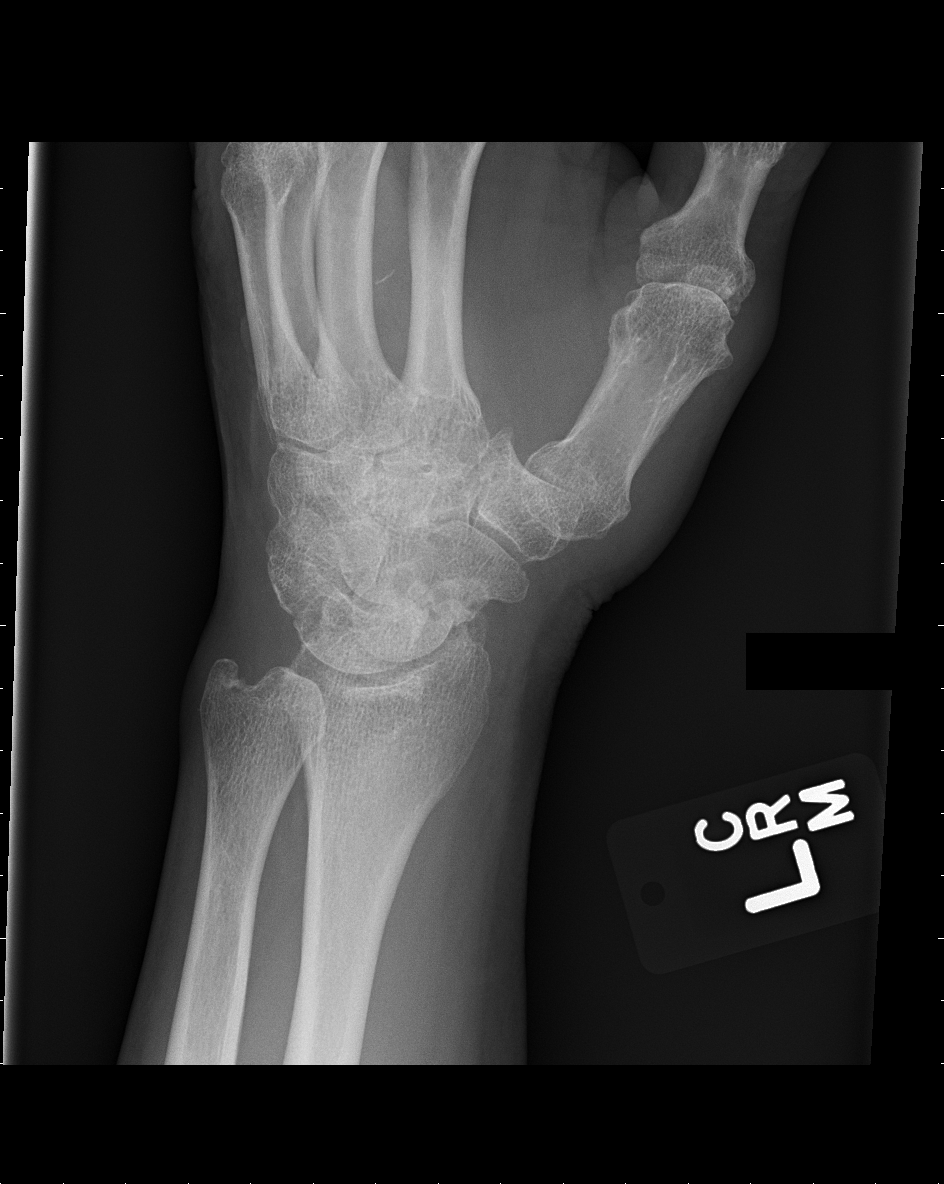

[lat wrist]
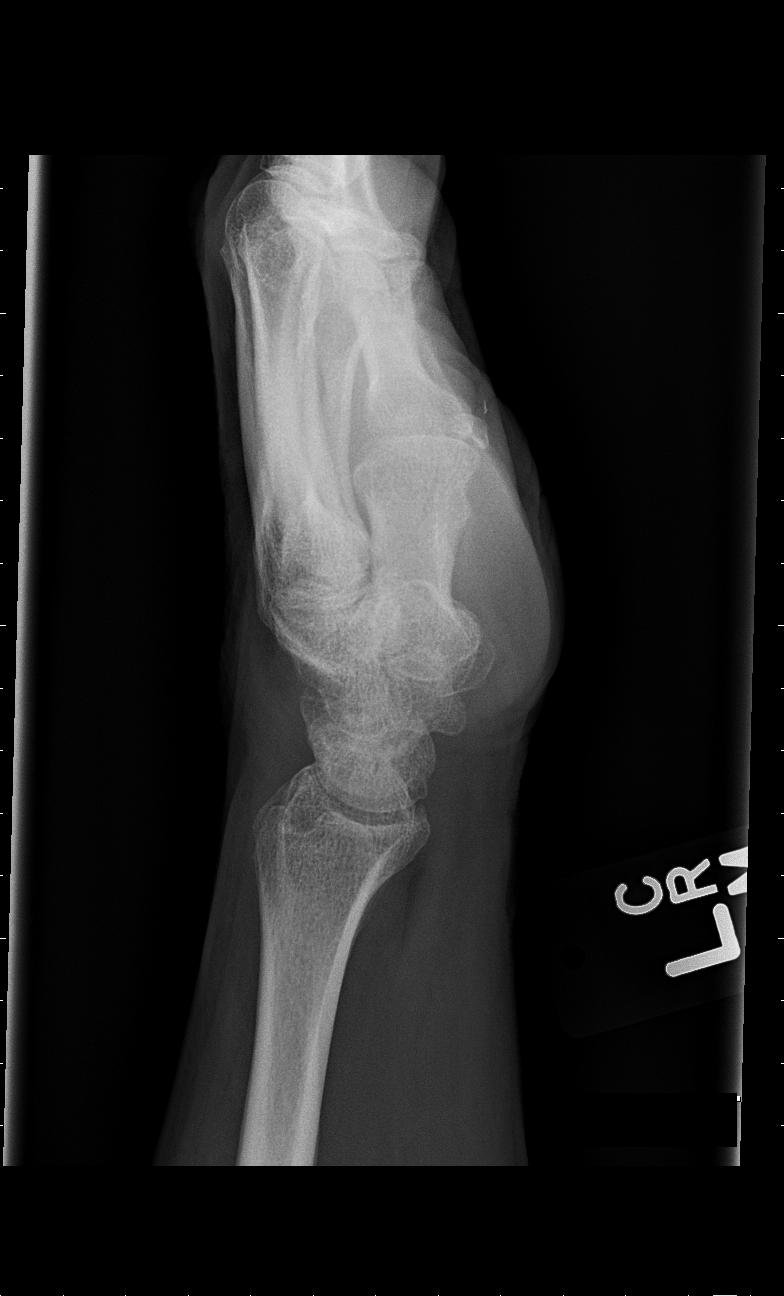

[PA (3 of 3)]
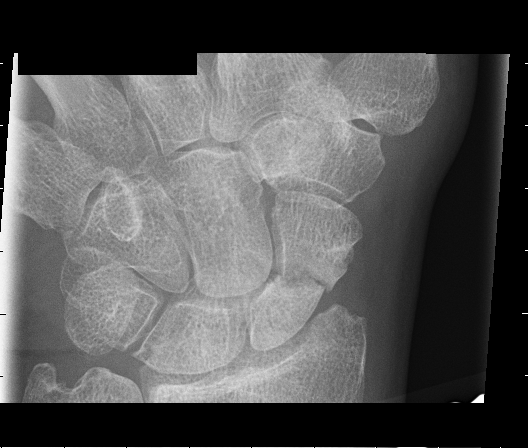

[4 of 4 positions shown; findings below may reference images not displayed]

FINDINGS: There is a mildly displaced fracture through the waist of the
scaphoid. There is some irregularity of the radial styloid but a
definite fracture is not observed. The other carpal bones are
intact. There is very mild narrowing of the radiocarpal joint space.
IMPRESSION: Mildly displaced scaphoid waist fracture. No fracture is observed
elsewhere.

## 2018-04-30 ENCOUNTER — Telehealth (INDEPENDENT_AMBULATORY_CARE_PROVIDER_SITE_OTHER): Payer: Self-pay | Admitting: *Deleted

## 2018-04-30 NOTE — Telephone Encounter (Signed)
Dr.Pomposini 's office called and ask if he could change the Patient's PPI from Esomeprazole to Omeprazole due to the cost. Patient later calls and expresses the same thing , and states that he would like Dr.Pomposini handle this medication and just see Dr.Rehman for problems with GI and for his procedures.  Per TerriSetzer,NP may switch the PPI, Dr.Pomposini's office called and made aware.

## 2020-12-08 ENCOUNTER — Encounter (INDEPENDENT_AMBULATORY_CARE_PROVIDER_SITE_OTHER): Payer: Self-pay | Admitting: *Deleted

## 2021-02-03 ENCOUNTER — Ambulatory Visit (INDEPENDENT_AMBULATORY_CARE_PROVIDER_SITE_OTHER): Payer: BLUE CROSS/BLUE SHIELD | Admitting: Gastroenterology

## 2021-04-21 ENCOUNTER — Other Ambulatory Visit: Payer: Self-pay

## 2021-04-21 ENCOUNTER — Ambulatory Visit (INDEPENDENT_AMBULATORY_CARE_PROVIDER_SITE_OTHER): Payer: Medicare Other | Admitting: Gastroenterology

## 2021-04-21 ENCOUNTER — Encounter (INDEPENDENT_AMBULATORY_CARE_PROVIDER_SITE_OTHER): Payer: Self-pay

## 2021-04-21 ENCOUNTER — Other Ambulatory Visit (INDEPENDENT_AMBULATORY_CARE_PROVIDER_SITE_OTHER): Payer: Self-pay

## 2021-04-21 ENCOUNTER — Encounter (INDEPENDENT_AMBULATORY_CARE_PROVIDER_SITE_OTHER): Payer: Self-pay | Admitting: Gastroenterology

## 2021-04-21 VITALS — BP 123/80 | HR 69 | Temp 98.0°F | Ht 67.0 in | Wt 204.2 lb

## 2021-04-21 DIAGNOSIS — R1319 Other dysphagia: Secondary | ICD-10-CM

## 2021-04-21 DIAGNOSIS — R1310 Dysphagia, unspecified: Secondary | ICD-10-CM | POA: Insufficient documentation

## 2021-04-21 DIAGNOSIS — R110 Nausea: Secondary | ICD-10-CM

## 2021-04-21 DIAGNOSIS — K219 Gastro-esophageal reflux disease without esophagitis: Secondary | ICD-10-CM | POA: Diagnosis not present

## 2021-04-21 NOTE — H&P (View-Only) (Signed)
Jaselyn Nahm Castaneda, M.D. Gastroenterology & Hepatology [facility] [address] [place], [place] [address] Primary Care Physician: Pomposini, Alisse Tuite L, MD No address on file  Referring MD: PCP  Chief Complaint: Dysphagia, GERD and nausea  History of Present Illness: William Ayers is a 69 y.o. 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  Patient reports that for the last two years he has presented recurrent dysphagia. He has presented worsening symptoms recently, presenting an episode every 2-3 days when eating meat or food like rice. He does not have any dysphagia with liquids, He has not thrown up the food but a few times he had to cough the food as he could not breath well. No episodes of food impaction.  He reports he takes omeprazole (believes 40 mg qday). Usually takes the omeprazole after having breakfast. Has an episode of heartburn every 2-3 weeks, for which he takes an antacid for breakthrough episodes. Used to be on Nexium which worked better but it was no longer covered by his current insurance. No odynophagia.  Also reports for the last 3-4 months he has fluctuating nausea without vomiting. He reports it usually happens after eating a few bites of food, usually when he drinks carbonated drinks.  The patient denies having any fever, chills, hematochezia, melena, hematemesis, abdominal distention, abdominal pain, diarrhea, jaundice, pruritus or weight loss.  Last EGD:2010, erosive esophagitis and corrugated esophagus, there was an esophageal ring dilated with Maloney up to [address] [place] Last Colonoscopy:2017 - diverticulosis, hemorrhoids. Recommended to repeat in 7 years  FHx: neg for any gastrointestinal/liver disease, mother breast cancer Social: neg smoking, alcohol or illicit drug use Surgical: no abdominal surgeries  Past Medical History: Past  Medical History:  Diagnosis Date   Back pain    GERD (gastroesophageal reflux disease)    Sleep apnea     Past Surgical History: Past Surgical History:  Procedure Laterality Date   COLONOSCOPY     X 3   COLONOSCOPY N/A 10/07/2015   Procedure: COLONOSCOPY;  Surgeon: Najeeb U Rehman, MD;  [facility]: [facility];  Service: Endoscopy;  Laterality: N/A;  730   Left inguinal repair      20 yrs or more    OPEN REDUCTION INTERNAL FIXATION (ORIF) SCAPHOID WITH ILIAC CREST BONE GRAFT Left 01/03/2016   Procedure: OPEN TREATMENT OF LEFT  SCAPHOID NONUNION WITH ILIAC CREST BONE GRAFT;  Surgeon: David Thompson, MD;  Location: [facility];  Service: Orthopedics;  Laterality: Left;  GENERAL ANESTHESIA WITH PRE-OP BLOCK   ROTATOR CUFF REPAIR     both shoulders. Left in April 2015, Rt in June 2016.    Family History:History reviewed. No pertinent family history.  Social History: Social History   Tobacco Use  Smoking Status Former   Packs/day: 0.25   Years: 4.00   Pack years: 1.00   Types: Cigarettes  Smokeless Tobacco Former  Tobacco Comments   quit 40 yrs ago   Social History   Substance and Sexual Activity  Alcohol Use No   Alcohol/week: 0.0 standard drinks   Social History   Substance and Sexual Activity  Drug Use No    Allergies: No Known Allergies  Medications: Current Outpatient Medications  Medication Sig Dispense Refill   atorvastatin (LIPITOR) 20 MG tablet atorvastatin 20 mg tablet  TAKE 1 TABLET BY MOUTH EVERY DAY     esomeprazole (NEXIUM) 40 MG capsule TAKE 1   CAPSULE 30 MINUTES BEFORE BREAKFAST 90 capsule 4   KRILL OIL PO Take by mouth. Daily     lisinopril (ZESTRIL) 10 MG tablet lisinopril 10 mg tablet  TAKE 1 TABLET BY MOUTH EVERY DAY     meloxicam (MOBIC) 15 MG tablet meloxicam 15 mg tablet     Multiple Vitamin (MULTIVITAMIN) tablet Take 1 tablet by mouth daily.     No current facility-administered medications for this visit.    Review of  Systems: GENERAL: negative for malaise, night sweats HEENT: No changes in hearing or vision, no nose bleeds or other nasal problems. NECK: Negative for lumps, goiter, pain and significant neck swelling RESPIRATORY: Negative for cough, wheezing CARDIOVASCULAR: Negative for chest pain, leg swelling, palpitations, orthopnea GI: SEE HPI MUSCULOSKELETAL: Negative for joint pain or swelling, back pain, and muscle pain. SKIN: Negative for lesions, rash PSYCH: Negative for sleep disturbance, mood disorder and recent psychosocial stressors. HEMATOLOGY Negative for prolonged bleeding, bruising easily, and swollen nodes. ENDOCRINE: Negative for cold or heat intolerance, polyuria, polydipsia and goiter. NEURO: negative for tremor, gait imbalance, syncope and seizures. The remainder of the review of systems is noncontributory.   Physical Exam: BP 123/80 (BP Location: Left Arm, Patient Position: Sitting, Cuff Size: Large)   Pulse 69   Temp 98 F (36.7 C) (Oral)   Ht 5' 7" (1.702 m)   Wt 204 lb 3.2 oz (92.6 kg)   BMI 31.98 kg/m  GENERAL: The patient is AO x3, in no acute distress. HEENT: Head is normocephalic and atraumatic. EOMI are intact. Mouth is well hydrated and without lesions. NECK: Supple. No masses LUNGS: Clear to auscultation. No presence of rhonchi/wheezing/rales. Adequate chest expansion HEART: RRR, normal s1 and s2. ABDOMEN: Soft, nontender, no guarding, no peritoneal signs, and nondistended. BS +. No masses. EXTREMITIES: Without any cyanosis, clubbing, rash, lesions or edema. NEUROLOGIC: AOx3, no focal motor deficit. SKIN: no jaundice, no rashes   Imaging/Labs: as above  I personally reviewed and interpreted the available labs, imaging and endoscopic files.  Impression and Plan: William Ayers is a 69 y.o. 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  Patient has presented relatively acute control  of his GERD with use of omeprazole with occasional breakthrough episodes.  However, he can improve the way in which he takes the medication to maximize the effect of the medicine.  I advised him to take medication at least 30 minutes before his breakfast, but also provided other recommendations to avoid any other episodes of reflux.  He can take some Pepcid as needed if he has breakthrough episodes instead of taking antacids.  He understood and agreed.  However, he has presented recurrent dysphagia and given the previous history of Schatzki's ring, we will need to evaluate this further with an EGD with possible esophageal dilation.  I consider that his nausea could be related to ongoing acid reflux episodes as it also happens more often when he takes carbonated drinks.  We will manage his nausea with his current PPI and evaluate it further with the esophagogastroduodenospy.  Patient understood and agreed.  - Schedule EGD with ED - Continue omeprazole every day - Explained presumed etiology of reflux symptoms. Instruction provided in the use of antireflux medication - patient should take medication in the morning 30-45 minutes before eating breakfast. Discussed avoidance of eating within 2 hours of lying down to sleep and benefit of blocks to elevate head of bed. Also, will   benefit from avoiding carbonated drinks/sodas or food that has tomatoes, spicy or greasy food.  All questions were answered.      Katrinka Blazing, MD Gastroenterology and Hepatology [facility]

## 2021-04-21 NOTE — Progress Notes (Signed)
Katrinka Blazing, M.D. Gastroenterology & Hepatology [facility] [address] [place], [place] [address] Primary Care Physician: Pomposini, Rande Brunt, MD No address on file  Referring MD: PCP  Chief Complaint: Dysphagia, GERD and nausea  History of Present Illness: William Ayers is a 70 y.o. 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  Patient reports that for the last two years he has presented recurrent dysphagia. He has presented worsening symptoms recently, presenting an episode every 2-3 days when eating meat or food like rice. He does not have any dysphagia with liquids, He has not thrown up the food but a few times he had to cough the food as he could not breath well. No episodes of food impaction.  He reports he takes omeprazole (believes 40 mg qday). Usually takes the omeprazole after having breakfast. Has an episode of heartburn every 2-3 weeks, for which he takes an antacid for breakthrough episodes. Used to be on Nexium which worked better but it was no longer covered by his current insurance. No odynophagia.  Also reports for the last 3-4 months he has fluctuating nausea without vomiting. He reports it usually happens after eating a few bites of food, usually when he drinks carbonated drinks.  The patient denies having any fever, chills, hematochezia, melena, hematemesis, abdominal distention, abdominal pain, diarrhea, jaundice, pruritus or weight loss.  Last EGD:2010, erosive esophagitis and corrugated esophagus, there was an esophageal ring dilated with Maloney up to 55 [place] Last Colonoscopy:2017 - diverticulosis, hemorrhoids. Recommended to repeat in 7 years  FHx: neg for any gastrointestinal/liver disease, mother breast cancer Social: neg smoking, alcohol or illicit drug use Surgical: no abdominal surgeries  Past Medical History: Past  Medical History:  Diagnosis Date   Back pain    GERD (gastroesophageal reflux disease)    Sleep apnea     Past Surgical History: Past Surgical History:  Procedure Laterality Date   COLONOSCOPY     X 3   COLONOSCOPY N/A 10/07/2015   Procedure: COLONOSCOPY;  Surgeon: Malissa Hippo, MD;  [facility]: [facility];  Service: Endoscopy;  Laterality: N/A;  730   Left inguinal repair      20 yrs or more    OPEN REDUCTION INTERNAL FIXATION (ORIF) SCAPHOID WITH ILIAC CREST BONE GRAFT Left 01/03/2016   Procedure: OPEN TREATMENT OF LEFT  SCAPHOID NONUNION WITH ILIAC CREST BONE GRAFT;  Surgeon: Mack Hook, MD;  Location: [facility];  Service: Orthopedics;  Laterality: Left;  GENERAL ANESTHESIA WITH PRE-OP BLOCK   ROTATOR CUFF REPAIR     both shoulders. Left in April 2015, Rt in June 2016.    Family History:History reviewed. No pertinent family history.  Social History: Social History   Tobacco Use  Smoking Status Former   Packs/day: 0.25   Years: 4.00   Pack years: 1.00   Types: Cigarettes  Smokeless Tobacco Former  Tobacco Comments   quit 40 yrs ago   Social History   Substance and Sexual Activity  Alcohol Use No   Alcohol/week: 0.0 standard drinks   Social History   Substance and Sexual Activity  Drug Use No    Allergies: No Known Allergies  Medications: Current Outpatient Medications  Medication Sig Dispense Refill   atorvastatin (LIPITOR) 20 MG tablet atorvastatin 20 mg tablet  TAKE 1 TABLET BY MOUTH EVERY DAY     esomeprazole (NEXIUM) 40 MG capsule TAKE 1  CAPSULE 30 MINUTES BEFORE BREAKFAST 90 capsule 4   KRILL OIL PO Take by mouth. Daily     lisinopril (ZESTRIL) 10 MG tablet lisinopril 10 mg tablet  TAKE 1 TABLET BY MOUTH EVERY DAY     meloxicam (MOBIC) 15 MG tablet meloxicam 15 mg tablet     Multiple Vitamin (MULTIVITAMIN) tablet Take 1 tablet by mouth daily.     No current facility-administered medications for this visit.    Review of  Systems: GENERAL: negative for malaise, night sweats HEENT: No changes in hearing or vision, no nose bleeds or other nasal problems. NECK: Negative for lumps, goiter, pain and significant neck swelling RESPIRATORY: Negative for cough, wheezing CARDIOVASCULAR: Negative for chest pain, leg swelling, palpitations, orthopnea GI: SEE HPI MUSCULOSKELETAL: Negative for joint pain or swelling, back pain, and muscle pain. SKIN: Negative for lesions, rash PSYCH: Negative for sleep disturbance, mood disorder and recent psychosocial stressors. HEMATOLOGY Negative for prolonged bleeding, bruising easily, and swollen nodes. ENDOCRINE: Negative for cold or heat intolerance, polyuria, polydipsia and goiter. NEURO: negative for tremor, gait imbalance, syncope and seizures. The remainder of the review of systems is noncontributory.   Physical Exam: BP 123/80 (BP Location: Left Arm, Patient Position: Sitting, Cuff Size: Large)   Pulse 69   Temp 98 F (36.7 C) (Oral)   Ht 5\' 7"  (1.702 m)   Wt 204 lb 3.2 oz (92.6 kg)   BMI 31.98 kg/m  GENERAL: The patient is AO x3, in no acute distress. HEENT: Head is normocephalic and atraumatic. EOMI are intact. Mouth is well hydrated and without lesions. NECK: Supple. No masses LUNGS: Clear to auscultation. No presence of rhonchi/wheezing/rales. Adequate chest expansion HEART: RRR, normal s1 and s2. ABDOMEN: Soft, nontender, no guarding, no peritoneal signs, and nondistended. BS +. No masses. EXTREMITIES: Without any cyanosis, clubbing, rash, lesions or edema. NEUROLOGIC: AOx3, no focal motor deficit. SKIN: no jaundice, no rashes   Imaging/Labs: as above  I personally reviewed and interpreted the available labs, imaging and endoscopic files.  Impression and Plan: William Ayers is a 70 y.o. 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  Patient has presented relatively acute control  of his GERD with use of omeprazole with occasional breakthrough episodes.  However, he can improve the way in which he takes the medication to maximize the effect of the medicine.  I advised him to take medication at least 30 minutes before his breakfast, but also provided other recommendations to avoid any other episodes of reflux.  He can take some Pepcid as needed if he has breakthrough episodes instead of taking antacids.  He understood and agreed.  However, he has presented recurrent dysphagia and given the previous history of Schatzki's ring, we will need to evaluate this further with an EGD with possible esophageal dilation.  I consider that his nausea could be related to ongoing acid reflux episodes as it also happens more often when he takes carbonated drinks.  We will manage his nausea with his current PPI and evaluate it further with the esophagogastroduodenospy.  Patient understood and agreed.  - Schedule EGD with ED - Continue omeprazole every day - Explained presumed etiology of reflux symptoms. Instruction provided in the use of antireflux medication - patient should take medication in the morning 30-45 minutes before eating breakfast. Discussed avoidance of eating within 2 hours of lying down to sleep and benefit of blocks to elevate head of bed. Also, will  benefit from avoiding carbonated drinks/sodas or food that has tomatoes, spicy or greasy food.  All questions were answered.      Katrinka Blazing, MD Gastroenterology and Hepatology [facility]

## 2021-04-21 NOTE — Patient Instructions (Signed)
Schedule EGD with ED Continue omeprazole every day Explained presumed etiology of reflux symptoms. Instruction provided in the use of antireflux medication - patient should take medication in the morning 30-45 minutes before eating breakfast. Discussed avoidance of eating within 2 hours of lying down to sleep and benefit of blocks to elevate head of bed. Also, will benefit from avoiding carbonated drinks/sodas or food that has tomatoes, spicy or greasy food.

## 2021-04-22 ENCOUNTER — Encounter (INDEPENDENT_AMBULATORY_CARE_PROVIDER_SITE_OTHER): Payer: Self-pay

## 2021-05-04 ENCOUNTER — Encounter (HOSPITAL_COMMUNITY)
Admission: RE | Admit: 2021-05-04 | Discharge: 2021-05-04 | Disposition: A | Discharge status: Home / Self Care | Payer: Medicare Other | Source: Ambulatory Visit | Attending: Gastroenterology | Admitting: Gastroenterology

## 2021-05-04 ENCOUNTER — Encounter (HOSPITAL_COMMUNITY): Payer: Self-pay

## 2021-05-04 ENCOUNTER — Other Ambulatory Visit: Payer: Self-pay

## 2021-05-04 VITALS — BP 117/88 | HR 60 | Temp 98.6°F | Resp 18 | Ht 67.0 in | Wt 195.0 lb

## 2021-05-04 DIAGNOSIS — T502X5A Adverse effect of carbonic-anhydrase inhibitors, benzothiadiazides and other diuretics, initial encounter: Secondary | ICD-10-CM | POA: Diagnosis not present

## 2021-05-04 DIAGNOSIS — X58XXXA Exposure to other specified factors, initial encounter: Secondary | ICD-10-CM | POA: Diagnosis not present

## 2021-05-04 DIAGNOSIS — Z01812 Encounter for preprocedural laboratory examination: Secondary | ICD-10-CM | POA: Insufficient documentation

## 2021-05-04 HISTORY — DX: Essential (primary) hypertension: I10

## 2021-05-04 LAB — BASIC METABOLIC PANEL
Anion gap: 6 (ref 5–15)
BUN: 18 mg/dL (ref 8–23)
CO2: 25 mmol/L (ref 22–32)
Calcium: 8.3 mg/dL — ABNORMAL LOW (ref 8.9–10.3)
Chloride: 102 mmol/L (ref 98–111)
Creatinine, Ser: 1.02 mg/dL (ref 0.61–1.24)
GFR, Estimated: >60 mL/min (ref >60)
Glucose, Bld: 84 mg/dL (ref 70–99)
Potassium: 4.1 mmol/L (ref 3.5–5.1)
Sodium: 133 mmol/L — ABNORMAL LOW (ref 135–145)

## 2021-05-04 NOTE — Patient Instructions (Signed)
William Ayers  05/04/2021     @PREFPERIOPPHARMACY @   Your procedure is scheduled on 05/10/2021.  Report to 05/12/2021 at 8:30 A.M.  Call this number if you have problems the morning of surgery:  [phone]   Remember:  Do not eat or drink after midnight.   Please follow the diet and prep instructions given to you by the doctor's office.     Take these medicines the morning of surgery with A SIP OF WATER : Prilosec and Zyrtec    Do not wear jewelry, make-up or nail polish.  Do not wear lotions, powders, or perfumes, or deodorant.  Do not shave 48 hours prior to surgery.  Men may shave face and neck.  Do not bring valuables to the hospital.  [facility] is not responsible for any belongings or valuables.  Contacts, dentures or bridgework may not be worn into surgery.  Leave your suitcase in the car.  After surgery it may be brought to your room.  For patients admitted to the hospital, discharge time will be determined by your treatment team.  Patients discharged the day of surgery will not be allowed to drive home.   Name and phone number of your driver:   Family Special instructions:  n/a  Please read over the following fact sheets that you were given. Care and Recovery After Surgery  Monitored Anesthesia Care Anesthesia refers to techniques, procedures, and medicines that help a person stay safe and comfortable during a medical or dental procedure. Monitored anesthesia care, or sedation, is one type of anesthesia. Your anesthesia specialist may recommend sedation if you will be having a procedure that does not require you to be unconscious. You may have this procedure for: Cataract surgery. A dental procedure. A biopsy. A colonoscopy. During the procedure, you may receive a medicine to help you relax (sedative). There are three levels of sedation: Mild sedation. At this level, you may feel awake and relaxed. You will be able to follow directions. Moderate sedation.  At this level, you will be sleepy. You may not remember the procedure. Deep sedation. At this level, you will be asleep. You will not remember the procedure. The more medicine you are given, the deeper your level of sedation will be. Depending on how you respond to the procedure, the anesthesia specialist may change your level of sedation or the type of anesthesia to fit your needs. An anesthesia specialist will monitor you closely during the procedure. Tell a health care provider about: Any allergies you have. All medicines you are taking, including vitamins, herbs, eye drops, creams, and over-the-counter medicines. Any problems you or family members have had with anesthetic medicines. Any blood disorders you have. Any surgeries you have had. Any medical conditions you have, such as sleep apnea. Whether you are pregnant or may be pregnant. Whether you use cigarettes, alcohol, or drugs. Any use of steroids, whether by mouth or as a cream. What are the risks? Generally, this is a safe procedure. However, problems may occur, including: Getting too much medicine (oversedation). Nausea. Allergic reaction to medicines. Trouble breathing. If this happens, a breathing tube may be used to help with breathing. It will be removed when you are awake and breathing on your own. Heart trouble. Lung trouble. Confusion that gets better with time (emergence delirium). What happens before the procedure? Staying hydrated Follow instructions from your health care provider about hydration, which may include: Up to 2 hours before the procedure - you may continue to  drink clear liquids, such as water, clear fruit juice, black coffee, and plain tea. Eating and drinking restrictions Follow instructions from your health care provider about eating and drinking, which may include: 8 hours before the procedure - stop eating heavy meals or foods, such as meat, fried foods, or fatty foods. 6 hours before the procedure  - stop eating light meals or foods, such as toast or cereal. 6 hours before the procedure - stop drinking milk or drinks that contain milk. 2 hours before the procedure - stop drinking clear liquids. Medicines Ask your health care provider about: Changing or stopping your regular medicines. This is especially important if you are taking diabetes medicines or blood thinners. Taking medicines such as aspirin and ibuprofen. These medicines can thin your blood. Do not take these medicines unless your health care provider tells you to take them. Taking over-the-counter medicines, vitamins, herbs, and supplements. Tests and exams You will have a physical exam. You may have blood tests done to show: How well your kidneys and liver are working. How well your blood can clot. General instructions Plan to have a responsible adult take you home from the hospital or clinic. If you will be going home right after the procedure, plan to have a responsible adult care for you for the time you are told. This is important. What happens during the procedure?  Your blood pressure, heart rate, breathing, level of pain, and overall condition will be monitored. An IV will be inserted into one of your veins. You will be given medicines as needed to keep you comfortable during the procedure. This may mean changing the level of sedation. Depending on your age or the procedure, the sedative may be given: As a pill that you will swallow or as a pill that is inserted into the rectum. As an injection into the vein or muscle. As a spray through the nose. The procedure will be performed. Your breathing, heart rate, and blood pressure will be monitored during the procedure. When the procedure is over, the medicine will be stopped. The procedure may vary among health care providers and hospitals. What happens after the procedure? Your blood pressure, heart rate, breathing rate, and blood oxygen level will be monitored  until you leave the hospital or clinic. You may feel sleepy, clumsy, or nauseous. You may feel forgetful about what happened after the procedure. You may vomit. You may continue to get IV fluids. Do not drive or operate machinery until your health care provider says that it is safe. Summary Monitored anesthesia care is used to keep a patient comfortable during short procedures. Tell your health care provider about any allergies or health conditions you have and about all the medicines you are taking. Before the procedure, follow instructions about when to stop eating and drinking and about changing or stopping any medicines. Your blood pressure, heart rate, breathing rate, and blood oxygen level will be monitored until you leave the hospital or clinic. Plan to have a responsible adult take you home from the hospital or clinic. This information is not intended to replace advice given to you by your health care provider. Make sure you discuss any questions you have with your health care provider. Document Revised: 03/18/2020 Document Reviewed: 06/05/2019 Elsevier Patient Education  2022 [employer] Upper Endoscopy, Adult Upper endoscopy is a procedure to look inside the upper GI (gastrointestinal) tract. The upper GI tract is made up of: The part of the body that moves food from your mouth  to your stomach (esophagus). The stomach. The first part of your small intestine (duodenum). This procedure is also called esophagogastroduodenoscopy (EGD) or gastroscopy. In this procedure, your health care provider passes a thin, flexible tube (endoscope) through your mouth and down your esophagus into your stomach. A small camera is attached to the end of the tube. Images from the camera appear on a monitor in the exam room. During this procedure, your health care provider may also remove a small piece of tissue to be sent to a lab and examined under a microscope (biopsy). Your health care provider may do  an upper endoscopy to diagnose cancers of the upper GI tract. You may also have this procedure to find the cause of other conditions, such as: Stomach pain. Heartburn. Pain or problems when swallowing. Nausea and vomiting. Stomach bleeding. Stomach ulcers. Tell a health care provider about: Any allergies you have. All medicines you are taking, including vitamins, herbs, eye drops, creams, and over-the-counter medicines. Any problems you or family members have had with anesthetic medicines. Any blood disorders you have. Any surgeries you have had. Any medical conditions you have. Whether you are pregnant or may be pregnant. What are the risks? Generally, this is a safe procedure. However, problems may occur, including: Infection. Bleeding. Allergic reactions to medicines. A tear or hole (perforation) in the esophagus, stomach, or duodenum. What happens before the procedure? Staying hydrated Follow instructions from your health care provider about hydration, which may include: Up to 2 hours before the procedure - you may continue to drink clear liquids, such as water, clear fruit juice, black coffee, and plain tea.  Eating and drinking restrictions Follow instructions from your health care provider about eating and drinking, which may include: 8 hours before the procedure - stop eating heavy meals or foods, such as meat, fried foods, or fatty foods. 6 hours before the procedure - stop eating light meals or foods, such as toast or cereal. 6 hours before the procedure - stop drinking milk or drinks that contain milk. 2 hours before the procedure - stop drinking clear liquids. Medicines Ask your health care provider about: Changing or stopping your regular medicines. This is especially important if you are taking diabetes medicines or blood thinners. Taking medicines such as aspirin and ibuprofen. These medicines can thin your blood. Do not take these medicines unless your health care  provider tells you to take them. Taking over-the-counter medicines, vitamins, herbs, and supplements. General instructions Plan to have someone take you home from the hospital or clinic. If you will be going home right after the procedure, plan to have someone with you for 24 hours. Ask your health care provider what steps will be taken to help prevent infection. What happens during the procedure?  An IV will be inserted into one of your veins. You may be given one or more of the following: A medicine to help you relax (sedative). A medicine to numb the throat (local anesthetic). You will lie on your left side on an exam table. Your health care provider will pass the endoscope through your mouth and down your esophagus. Your health care provider will use the scope to check the inside of your esophagus, stomach, and duodenum. Biopsies may be taken. The endoscope will be removed. The procedure may vary among health care providers and hospitals. What happens after the procedure? Your blood pressure, heart rate, breathing rate, and blood oxygen level will be monitored until you leave the hospital or clinic. Do not  drive for 24 hours if you were given a sedative during your procedure. When your throat is no longer numb, you may be given some fluids to drink. It is up to you to get the results of your procedure. Ask your health care provider, or the department that is doing the procedure, when your results will be ready. Summary Upper endoscopy is a procedure to look inside the upper GI tract. During the procedure, an IV will be inserted into one of your veins. You may be given a medicine to help you relax. A medicine will be used to numb your throat. The endoscope will be passed through your mouth and down your esophagus. This information is not intended to replace advice given to you by your health care provider. Make sure you discuss any questions you have with your health care  provider. Document Revised: 12/26/2017 Document Reviewed: 12/03/2017 Elsevier Patient Education  2022 [employer].

## 2021-05-05 ENCOUNTER — Encounter (HOSPITAL_COMMUNITY): Payer: Medicare Other

## 2021-05-10 ENCOUNTER — Ambulatory Visit (HOSPITAL_COMMUNITY): Payer: Medicare Other | Admitting: Anesthesiology

## 2021-05-10 ENCOUNTER — Ambulatory Visit (HOSPITAL_COMMUNITY)
Admission: RE | Admit: 2021-05-10 | Discharge: 2021-05-10 | Disposition: A | Discharge status: Home / Self Care | Payer: Medicare Other | Source: Ambulatory Visit | Attending: Gastroenterology | Admitting: Gastroenterology

## 2021-05-10 ENCOUNTER — Encounter (HOSPITAL_COMMUNITY)
Admission: RE | Disposition: A | Discharge status: Home / Self Care | Payer: Self-pay | Source: Ambulatory Visit | Attending: Gastroenterology

## 2021-05-10 DIAGNOSIS — K317 Polyp of stomach and duodenum: Secondary | ICD-10-CM | POA: Diagnosis not present

## 2021-05-10 DIAGNOSIS — K2980 Duodenitis without bleeding: Secondary | ICD-10-CM | POA: Insufficient documentation

## 2021-05-10 DIAGNOSIS — K3189 Other diseases of stomach and duodenum: Secondary | ICD-10-CM | POA: Insufficient documentation

## 2021-05-10 DIAGNOSIS — R1310 Dysphagia, unspecified: Secondary | ICD-10-CM | POA: Diagnosis present

## 2021-05-10 DIAGNOSIS — Z79899 Other long term (current) drug therapy: Secondary | ICD-10-CM | POA: Diagnosis not present

## 2021-05-10 DIAGNOSIS — Z87891 Personal history of nicotine dependence: Secondary | ICD-10-CM | POA: Insufficient documentation

## 2021-05-10 DIAGNOSIS — K449 Diaphragmatic hernia without obstruction or gangrene: Secondary | ICD-10-CM | POA: Diagnosis not present

## 2021-05-10 DIAGNOSIS — K219 Gastro-esophageal reflux disease without esophagitis: Secondary | ICD-10-CM | POA: Insufficient documentation

## 2021-05-10 DIAGNOSIS — K222 Esophageal obstruction: Secondary | ICD-10-CM | POA: Insufficient documentation

## 2021-05-10 DIAGNOSIS — Z791 Long term (current) use of non-steroidal anti-inflammatories (NSAID): Secondary | ICD-10-CM | POA: Insufficient documentation

## 2021-05-10 HISTORY — PX: POLYPECTOMY: SHX5525

## 2021-05-10 HISTORY — PX: ESOPHAGEAL DILATION: SHX303

## 2021-05-10 HISTORY — PX: ESOPHAGOGASTRODUODENOSCOPY (EGD) WITH PROPOFOL: SHX5813

## 2021-05-10 SURGERY — ESOPHAGOGASTRODUODENOSCOPY (EGD) WITH PROPOFOL
Anesthesia: General

## 2021-05-10 MED ORDER — PROPOFOL 10 MG/ML IV BOLUS
INTRAVENOUS | Status: DC | PRN
  Administered 2021-05-10: 100 ug/kg/min via INTRAVENOUS
  Administered 2021-05-10: 100 mg via INTRAVENOUS

## 2021-05-10 MED ORDER — LIDOCAINE HCL (CARDIAC) PF 100 MG/5ML IV SOSY
PREFILLED_SYRINGE | INTRAVENOUS | Status: DC | PRN
  Administered 2021-05-10: 100 mg via INTRAVENOUS

## 2021-05-10 MED ORDER — LACTATED RINGERS IV SOLN: INTRAVENOUS | Status: DC

## 2021-05-10 NOTE — Discharge Instructions (Addendum)
You are being discharged to home.  Resume your previous diet.  We are waiting for your pathology results.  

## 2021-05-10 NOTE — Op Note (Signed)
[facility] Patient Name: William Ayers Procedure Date: 05/10/2021 9:26 AM MRN: [id number] Date of Birth: March 22, 1951 Attending MD: Katrinka Blazing ,  CSN: [id number] Age: 70 Admit Type: Outpatient Procedure:                Upper GI endoscopy Indications:              Dysphagia Providers:                Katrinka Blazing, Crystal Page, Pandora Leiter,                            Technician Referring MD:              Medicines:                Monitored Anesthesia Care Complications:            No immediate complications. Estimated Blood Loss:     Estimated blood loss: none. Procedure:                Pre-Anesthesia Assessment:                           - Prior to the procedure, a History and Physical                            was performed, and patient medications, allergies                            and sensitivities were reviewed. The patient's                            tolerance of previous anesthesia was reviewed.                           - The risks and benefits of the procedure and the                            sedation options and risks were discussed with the                            patient. All questions were answered and informed                            consent was obtained.                           - ASA Grade Assessment: II - A patient with mild                            systemic disease.                           After obtaining informed consent, the endoscope was                            passed under direct vision. Throughout the  procedure, the patient's blood pressure, pulse, and                            oxygen saturations were monitored continuously. The                            GIF-H190 ([id number]) scope was introduced through the                            mouth, and advanced to the second part of duodenum.                            The upper GI endoscopy was accomplished without                            difficulty.  The patient tolerated the procedure                            well. Scope In: 9:38:22 AM Scope Out: 9:47:43 AM Total Procedure Duration: 0 hours 9 minutes 21 seconds  Findings:      A 2 cm hiatal hernia was present.      No endoscopic abnormality was evident in the esophagus to explain the       patient's complaint of dysphagia. It was decided, however, to proceed       with dilation of the entire esophagus. A guidewire was placed and the       scope was withdrawn. Dilation was performed with a Savary dilator with       mild resistance at 18 mm. There was presence of mucosal disruption upon       reinspection.      Multiple 3 to 8 mm sessile fundic gland polyps with no bleeding were       found in the gastric fundus and in the gastric body.      A single 3 mm mucosal nodule with a localized distribution was found in       the duodenal bulb. Biopsies were taken with a cold forceps for histology.      The exam of the duodenum was otherwise normal. Impression:               - 2 cm hiatal hernia.                           - No endoscopic esophageal abnormality to explain                            patient's dysphagia. Esophagus dilated. Dilated.                           - Multiple fundic gland polyps.                           - Mucosal nodule found in the duodenum. Biopsied. Moderate Sedation:      Per Anesthesia Care Recommendation:           - Discharge patient to home (ambulatory).                           -  Resume previous diet.                           - Await pathology results. Procedure Code(s):        --- Professional ---                           [phone], Esophagogastroduodenoscopy, flexible,                            transoral; with insertion of guide wire followed by                            passage of dilator(s) through esophagus over guide                            wire                           43239, 59, Esophagogastroduodenoscopy, flexible,                             transoral; with biopsy, single or multiple Diagnosis Code(s):        --- Professional ---                           K44.9, Diaphragmatic hernia without obstruction or                            gangrene                           R13.10, Dysphagia, unspecified                           K31.7, Polyp of stomach and duodenum                           K31.89, Other diseases of stomach and duodenum CPT copyright 2019 American Medical Association. All rights reserved. The codes documented in this report are preliminary and upon coder review may  be revised to meet current compliance requirements. Katrinka Blazing, MD Katrinka Blazing,  05/10/2021 9:53:54 AM This report has been signed electronically. Number of Addenda: 0

## 2021-05-10 NOTE — Transfer of Care (Signed)
Immediate Anesthesia Transfer of Care Note  Patient: William Ayers  Procedure(s) Performed: ESOPHAGOGASTRODUODENOSCOPY (EGD) WITH PROPOFOL ESOPHAGEAL DILATION POLYPECTOMY  Patient Location: Short Stay  Anesthesia Type:General  Level of Consciousness: awake, alert , oriented, patient cooperative and responds to stimulation  Airway & Oxygen Therapy: Patient Spontanous Breathing  Post-op Assessment: Report given to RN, Post -op Vital signs reviewed and stable and Patient moving all extremities X 4  Post vital signs: Reviewed and stable  Last Vitals:  Vitals Value Taken Time  BP    Temp    Pulse    Resp    SpO2      Last Pain:  Vitals:   05/10/21 0935  TempSrc:   PainSc: 2       Patients Stated Pain Goal: 5 (05/10/21 0831)  Complications: No notable events documented.

## 2021-05-10 NOTE — Interval H&P Note (Signed)
History and Physical Interval Note:  05/10/2021 9:33 AM [name] is a 70 y.o. 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   BP 114/72   Pulse (!) 56   Temp 98.2 F (36.8 C) (Oral)   Resp 18   SpO2 96%  GENERAL: The patient is AO x3, in no acute distress. HEENT: Head is normocephalic and atraumatic. EOMI are intact. Mouth is well hydrated and without lesions. NECK: Supple. No masses LUNGS: Clear to auscultation. No presence of rhonchi/wheezing/rales. Adequate chest expansion HEART: RRR, normal s1 and s2. ABDOMEN: Soft, nontender, no guarding, no peritoneal signs, and nondistended. BS +. No masses. EXTREMITIES: Without any cyanosis, clubbing, rash, lesions or edema. NEUROLOGIC: AOx3, no focal motor deficit. SKIN: no jaundice, no rashes  William Ayers  has presented today for surgery, with the diagnosis of Dypshagia GERD.  The various methods of treatment have been discussed with the patient and family. After consideration of risks, benefits and other options for treatment, the patient has consented to  Procedure(s) with comments: ESOPHAGOGASTRODUODENOSCOPY (EGD) WITH PROPOFOL (N/A) - 9:50 ESOPHAGEAL DILATION (N/A) as a surgical intervention.  The patient's history has been reviewed, patient examined, no change in status, stable for surgery.  I have reviewed the patient's chart and labs.  Questions were answered to the patient's satisfaction.     [name]

## 2021-05-10 NOTE — Anesthesia Preprocedure Evaluation (Addendum)
Anesthesia Evaluation  Patient identified by MRN, date of birth, ID band Patient awake    Reviewed: Allergy & Precautions, NPO status , Patient's Chart, lab work & pertinent test results  Airway Mallampati: II  TM Distance: >3 FB Neck ROM: Full    Dental  (+) Dental Advisory Given, Caps   Pulmonary sleep apnea , former smoker,    Pulmonary exam normal breath sounds clear to auscultation       Cardiovascular Exercise Tolerance: Good hypertension, Pt. on medications Normal cardiovascular exam Rhythm:Regular Rate:Normal     Neuro/Psych negative neurological ROS  negative psych ROS   GI/Hepatic Neg liver ROS, GERD  Medicated and Controlled,  Endo/Other  negative endocrine ROS  Renal/GU negative Renal ROS  negative genitourinary   Musculoskeletal negative musculoskeletal ROS (+)   Abdominal   Peds negative pediatric ROS (+)  Hematology negative hematology ROS (+)   Anesthesia Other Findings   Reproductive/Obstetrics negative OB ROS                             Anesthesia Physical Anesthesia Plan  ASA: 2  Anesthesia Plan: General   Post-op Pain Management:    Induction: Intravenous  PONV Risk Score and Plan: TIVA  Airway Management Planned: Nasal Cannula and Natural Airway  Additional Equipment:   Intra-op Plan:   Post-operative Plan:   Informed Consent: I have reviewed the patients History and Physical, chart, labs and discussed the procedure including the risks, benefits and alternatives for the proposed anesthesia with the patient or authorized representative who has indicated his/her understanding and acceptance.     Dental advisory given  Plan Discussed with: CRNA and Surgeon  Anesthesia Plan Comments:         Anesthesia Quick Evaluation

## 2021-05-10 NOTE — Anesthesia Postprocedure Evaluation (Signed)
Anesthesia Post Note  Patient: William Ayers  Procedure(s) Performed: ESOPHAGOGASTRODUODENOSCOPY (EGD) WITH PROPOFOL ESOPHAGEAL DILATION POLYPECTOMY  Patient location during evaluation: Phase II Anesthesia Type: General Level of consciousness: awake and alert Pain management: pain level controlled Vital Signs Assessment: post-procedure vital signs reviewed and stable Respiratory status: spontaneous breathing, nonlabored ventilation and respiratory function stable Cardiovascular status: blood pressure returned to baseline and stable Postop Assessment: no apparent nausea or vomiting Anesthetic complications: no   No notable events documented.   Last Vitals:  Vitals:   05/10/21 0831 05/10/21 0953  BP: 114/72 (!) 100/53  Pulse: (!) 56 (!) 55  Resp: 18 14  Temp: 36.8 C 36.7 C  SpO2: 96% 98%    Last Pain:  Vitals:   05/10/21 0953  TempSrc: Oral  PainSc: 0-No pain                 Shunte Senseney C Yulanda Diggs

## 2021-05-11 LAB — SURGICAL PATHOLOGY

## 2021-05-12 ENCOUNTER — Encounter (HOSPITAL_COMMUNITY): Payer: Self-pay | Admitting: Gastroenterology

## 2022-09-06 ENCOUNTER — Encounter (INDEPENDENT_AMBULATORY_CARE_PROVIDER_SITE_OTHER): Payer: Self-pay | Admitting: *Deleted

## 2022-09-15 ENCOUNTER — Telehealth (INDEPENDENT_AMBULATORY_CARE_PROVIDER_SITE_OTHER): Payer: Self-pay | Admitting: Gastroenterology

## 2022-09-15 ENCOUNTER — Telehealth (INDEPENDENT_AMBULATORY_CARE_PROVIDER_SITE_OTHER): Payer: Self-pay | Admitting: Internal Medicine

## 2022-09-15 NOTE — Telephone Encounter (Signed)
Any room Thanks

## 2022-09-15 NOTE — Telephone Encounter (Signed)
error 

## 2022-09-15 NOTE — Telephone Encounter (Signed)
Who is your primary care physician: Dr.Daniel Pomposini   Reasons for the colonoscopy: Screening   Have you had a colonoscopy before?  Yes 2019  Do you have family history of colon cancer? no  Previous colonoscopy with polyps removed? Yes not sure  Do you have a history colorectal cancer?   no  Are you diabetic? If yes, Type 1 or Type 2?    no  Do you have a prosthetic or mechanical heart valve? no  Do you have a pacemaker/defibrillator?   no  Have you had endocarditis/atrial fibrillation? no  Have you had joint replacement within the last 12 months?  no  Do you tend to be constipated or have to use laxatives? no  Do you have any history of drugs or alchohol?  no  Do you use supplemental oxygen?  no  Have you had a stroke or heart attack within the last 6 months?no  Do you take weight loss medication? no     Do you take any blood-thinning medications such as: (aspirin, warfarin, Plavix, Aggrenox)  no  If yes we need the name, milligram, dosage and who is prescribing doctor  Current Outpatient Medications on File Prior to Visit  Medication Sig Dispense Refill   atorvastatin (LIPITOR) 20 MG tablet Take 20 mg by mouth daily.     lisinopril (ZESTRIL) 10 MG tablet Take 10 mg by mouth daily.     meloxicam (MOBIC) 15 MG tablet Take 15 mg by mouth daily.     Multiple Vitamin (MULTIVITAMIN) tablet Take 1 tablet by mouth daily.     omeprazole (PRILOSEC) 40 MG capsule Take 40 mg by mouth daily.     No current facility-administered medications on file prior to visit.    No Known Allergies   Pharmacy: [facility]  Primary Insurance Name: Elvin So, Allison [place]ar[place], [place] number where you can be reached: [phone]

## 2022-09-19 MED ORDER — PEG 3350-KCL-NA BICARB-NACL 420 G PO SOLR: 4000.0000 mL | Freq: Once | ORAL | 0 refills | Status: AC

## 2022-09-19 NOTE — Telephone Encounter (Signed)
Pt contacted and TCS scheduled for 10/18/22. Prep sent to pharmacy. Instructions will be mailed to patient.

## 2022-09-19 NOTE — Telephone Encounter (Signed)
Questionnaire from recall, no referral needed  

## 2022-09-19 NOTE — Addendum Note (Signed)
Addended by: Vicente Males on: 09/19/2022 10:14 AM   Modules accepted: Orders

## 2022-09-27 ENCOUNTER — Telehealth (INDEPENDENT_AMBULATORY_CARE_PROVIDER_SITE_OTHER): Payer: Self-pay | Admitting: Gastroenterology

## 2022-09-27 NOTE — Telephone Encounter (Signed)
Pt called in and wanted to reschedule Colonoscopy. Pt originally on for 10/18/22; has been moved to 11/08/22. Pt wanted afternoon appt. Pt states he still has prep. Will send updated instructions via mail. Secure chat has been sent to [facility] making them aware.

## 2022-11-06 ENCOUNTER — Telehealth (INDEPENDENT_AMBULATORY_CARE_PROVIDER_SITE_OTHER): Payer: Self-pay | Admitting: Gastroenterology

## 2022-11-06 NOTE — Telephone Encounter (Signed)
Pt called in to verify what time he needed to be at hospital on 11/08/22. Pt scheduled for 1:00pm;advised pt to be at [facility] at 11:30am

## 2022-11-08 ENCOUNTER — Ambulatory Visit (HOSPITAL_COMMUNITY)
Admission: RE | Admit: 2022-11-08 | Discharge: 2022-11-08 | Disposition: A | Discharge status: Home / Self Care | Payer: Medicare Other | Attending: Gastroenterology | Admitting: Gastroenterology

## 2022-11-08 ENCOUNTER — Encounter (HOSPITAL_COMMUNITY): Payer: Self-pay | Admitting: Gastroenterology

## 2022-11-08 ENCOUNTER — Encounter (HOSPITAL_COMMUNITY)
Admission: RE | Disposition: A | Discharge status: Home / Self Care | Payer: Self-pay | Source: Home / Self Care | Attending: Gastroenterology

## 2022-11-08 ENCOUNTER — Ambulatory Visit (HOSPITAL_COMMUNITY): Payer: Medicare Other | Admitting: Anesthesiology

## 2022-11-08 ENCOUNTER — Other Ambulatory Visit: Payer: Self-pay

## 2022-11-08 ENCOUNTER — Ambulatory Visit (HOSPITAL_BASED_OUTPATIENT_CLINIC_OR_DEPARTMENT_OTHER): Payer: Medicare Other | Admitting: Anesthesiology

## 2022-11-08 DIAGNOSIS — Z860100 Personal history of colon polyps, unspecified: Secondary | ICD-10-CM

## 2022-11-08 DIAGNOSIS — Z1211 Encounter for screening for malignant neoplasm of colon: Secondary | ICD-10-CM | POA: Diagnosis present

## 2022-11-08 DIAGNOSIS — D122 Benign neoplasm of ascending colon: Secondary | ICD-10-CM

## 2022-11-08 DIAGNOSIS — I10 Essential (primary) hypertension: Secondary | ICD-10-CM | POA: Insufficient documentation

## 2022-11-08 DIAGNOSIS — K219 Gastro-esophageal reflux disease without esophagitis: Secondary | ICD-10-CM | POA: Insufficient documentation

## 2022-11-08 DIAGNOSIS — K648 Other hemorrhoids: Secondary | ICD-10-CM

## 2022-11-08 DIAGNOSIS — G4733 Obstructive sleep apnea (adult) (pediatric): Secondary | ICD-10-CM | POA: Diagnosis not present

## 2022-11-08 DIAGNOSIS — Z87891 Personal history of nicotine dependence: Secondary | ICD-10-CM

## 2022-11-08 DIAGNOSIS — Z09 Encounter for follow-up examination after completed treatment for conditions other than malignant neoplasm: Secondary | ICD-10-CM | POA: Insufficient documentation

## 2022-11-08 HISTORY — PX: COLONOSCOPY WITH PROPOFOL: SHX5780

## 2022-11-08 HISTORY — PX: POLYPECTOMY: SHX149

## 2022-11-08 LAB — HM COLONOSCOPY

## 2022-11-08 SURGERY — COLONOSCOPY WITH PROPOFOL
Anesthesia: General

## 2022-11-08 MED ORDER — LIDOCAINE HCL (CARDIAC) PF 100 MG/5ML IV SOSY
PREFILLED_SYRINGE | INTRAVENOUS | Status: DC | PRN
  Administered 2022-11-08: 50 mg via INTRAVENOUS

## 2022-11-08 MED ORDER — STERILE WATER FOR IRRIGATION IR SOLN
Status: DC | PRN
  Administered 2022-11-08: 60 mL

## 2022-11-08 MED ORDER — EPHEDRINE SULFATE (PRESSORS) 50 MG/ML IJ SOLN
INTRAMUSCULAR | Status: DC | PRN
  Administered 2022-11-08 (×4): 5 mg via INTRAVENOUS

## 2022-11-08 MED ORDER — PROPOFOL 10 MG/ML IV BOLUS
INTRAVENOUS | Status: DC | PRN
  Administered 2022-11-08: 80 mg via INTRAVENOUS

## 2022-11-08 MED ORDER — LACTATED RINGERS IV SOLN: INTRAVENOUS | Status: DC

## 2022-11-08 MED ORDER — PROPOFOL 500 MG/50ML IV EMUL
INTRAVENOUS | Status: DC | PRN
  Administered 2022-11-08: 150 ug/kg/min via INTRAVENOUS

## 2022-11-08 NOTE — Op Note (Signed)
[facility] Patient Name: William Ayers Procedure Date: 11/08/2022 12:11 PM MRN: [id number] Date of Birth: [birth year] Attending MD: Katrinka Blazing , , [id number] CSN: [id number] Age: 72 Admit Type: Outpatient Procedure:                Colonoscopy Indications:              Screening for colorectal malignant neoplasm Providers:                Katrinka Blazing, Crystal Page, Lennice Sites                            Technician, Technician Referring MD:              Medicines:                Monitored Anesthesia Care Complications:            No immediate complications. Estimated Blood Loss:     Estimated blood loss: none. Procedure:                Pre-Anesthesia Assessment:                           - Prior to the procedure, a History and Physical                            was performed, and patient medications, allergies                            and sensitivities were reviewed. The patient's                            tolerance of previous anesthesia was reviewed.                           - The risks and benefits of the procedure and the                            sedation options and risks were discussed with the                            patient. All questions were answered and informed                            consent was obtained.                           - ASA Grade Assessment: II - A patient with mild                            systemic disease.                           After obtaining informed consent, the colonoscope                            was passed under direct vision. Throughout the  procedure, the patient's blood pressure, pulse, and                            oxygen saturations were monitored continuously. The                            [id number]-HQ190L ([id number]) scope was introduced through                            the anus and advanced to the the cecum, identified                            by appendiceal orifice and ileocecal  valve. The                            colonoscopy was performed without difficulty. The                            patient tolerated the procedure well. The quality                            of the bowel preparation was good. Scope In: 12:18:30 PM Scope Out: 12:45:29 PM Scope Withdrawal Time: 0 hours 20 minutes 51 seconds  Total Procedure Duration: 0 hours 26 minutes 59 seconds  Findings:      The perianal and digital rectal examinations were normal.      A 2 mm polyp was found in the ascending colon. The polyp was sessile.       The polyp was removed with a cold snare. Resection and retrieval were       complete.      Non-bleeding internal hemorrhoids were found during retroflexion. The       hemorrhoids were small. Impression:               - One 2 mm polyp in the ascending colon, removed                            with a cold snare. Resected and retrieved.                           - Non-bleeding internal hemorrhoids. Moderate Sedation:      Per Anesthesia Care Recommendation:           - Discharge patient to home (ambulatory).                           - Resume previous diet.                           - Await pathology results.                           - Repeat colonoscopy for surveillance based on                            pathology results. Procedure Code(s):        ---  Professional ---                           [id number], Colonoscopy, flexible; with removal of                            tumor(s), polyp(s), or other lesion(s) by snare                            technique Diagnosis Code(s):        --- Professional ---                           Z12.11, Encounter for screening for malignant                            neoplasm of colon                           D12.2, Benign neoplasm of ascending colon                           K64.8, Other hemorrhoids CPT copyright 2022 American [employer] Association. All rights reserved. The codes documented in this report are preliminary and upon  coder review may  be revised to meet current compliance requirements. Katrinka Blazing, MD Katrinka Blazing,  11/08/2022 12:49:26 PM This report has been signed electronically. Number of Addenda: 0

## 2022-11-08 NOTE — Discharge Instructions (Signed)
You are being discharged to home.  Resume your previous diet.  We are waiting for your pathology results.  Your physician has recommended a repeat colonoscopy for surveillance based on pathology results.  

## 2022-11-08 NOTE — H&P (Signed)
William Ayers is an 71 y.o. male.   Chief Complaint: screening colonoscopy HPI: 72 year old male with past medical history of GERD, hypertension and OSA, coming for screening colonoscopy.  Last colonoscopy in 2017 was within normal limits.  The patient denies having any complaints such as melena, hematochezia, abdominal pain or distention, change in her bowel movement consistency or frequency, no changes in weight recently.  No family history of colorectal cancer.   Past Medical History:  Diagnosis Date   Back pain    GERD (gastroesophageal reflux disease)    Hypertension    Sleep apnea     Past Surgical History:  Procedure Laterality Date   COLONOSCOPY     X 3   COLONOSCOPY N/A 10/07/2015   Procedure: COLONOSCOPY;  Surgeon: Malissa Hippo, MD;  [facility];  Service: Endoscopy;  Laterality: N/A;  730   ESOPHAGEAL DILATION N/A 05/10/2021   Procedure: ESOPHAGEAL DILATION;  Surgeon: Dolores Frame, MD;  [facility];  Service: Gastroenterology;  Laterality: N/A;   ESOPHAGOGASTRODUODENOSCOPY (EGD) WITH PROPOFOL N/A 05/10/2021   Procedure: ESOPHAGOGASTRODUODENOSCOPY (EGD) WITH PROPOFOL;  Surgeon: Dolores Frame, MD;  [facility];  Service: Gastroenterology;  Laterality: N/A;  9:50   Left inguinal repair      20 yrs or more    LUMBAR DISC SURGERY     OPEN REDUCTION INTERNAL FIXATION (ORIF) SCAPHOID WITH ILIAC CREST BONE GRAFT Left 01/03/2016   Procedure: OPEN TREATMENT OF LEFT  SCAPHOID NONUNION WITH ILIAC CREST BONE GRAFT;  Surgeon: Mack Hook, MD;  Location: [facility];  Service: Orthopedics;  Laterality: Left;  GENERAL ANESTHESIA WITH PRE-OP BLOCK   POLYPECTOMY  05/10/2021   Procedure: POLYPECTOMY;  Surgeon: Dolores Frame, MD;  [facility];  Service: Gastroenterology;;   ROTATOR CUFF REPAIR     both shoulders. Left in April 2015, Rt in June 2016.   WRIST FRACTURE SURGERY Left      History reviewed. No pertinent family history. Social History:  reports that he has quit smoking. His smoking use included cigarettes. He has a 1.00 pack-year smoking history. He has quit using smokeless tobacco. He reports that he does not drink alcohol and does not use drugs.  Allergies:  Allergies  Allergen Reactions   Alpha-Gal Itching    Medications Prior to Admission  Medication Sig Dispense Refill   acetaminophen (TYLENOL) 500 MG tablet Take 1,000 mg by mouth at bedtime.     atorvastatin (LIPITOR) 20 MG tablet Take 20 mg by mouth daily.     meclizine (ANTIVERT) 25 MG tablet Take 25 mg by mouth daily as needed for dizziness.     meloxicam (MOBIC) 15 MG tablet Take 15 mg by mouth daily.     Multiple Vitamin (MULTIVITAMIN) tablet Take 1 tablet by mouth daily.     omeprazole (PRILOSEC) 40 MG capsule Take 40 mg by mouth daily.      No results found for this or any previous visit (from the past 48 hour(s)). No results found.  Review of Systems  All other systems reviewed and are negative.   Blood pressure 121/81, pulse 68, temperature 97.9 F (36.6 C), temperature source Oral, resp. rate 19, height  (1.702 m), weight 83.9 kg, SpO2 94 %. Physical Exam  GENERAL: The patient is AO x3, in no acute distress. HEENT: Head is normocephalic and atraumatic. EOMI are intact. Mouth is well hydrated and without lesions. NECK: Supple. No masses LUNGS: Clear to auscultation.  No presence of rhonchi/wheezing/rales. Adequate chest expansion HEART: RRR, normal s1 and s2. ABDOMEN: Soft, nontender, no guarding, no peritoneal signs, and nondistended. BS +. No masses. EXTREMITIES: Without any cyanosis, clubbing, rash, lesions or edema. NEUROLOGIC: AOx3, no focal motor deficit. SKIN: no jaundice, no rashes  Assessment/Plan 72 year old male with past medical history of GERD, hypertension and OSA, coming for screening colonoscopy.  Will proceed with colonoscopy.`  Dolores Frame, MD 11/08/2022, 12:05 PM

## 2022-11-08 NOTE — Anesthesia Postprocedure Evaluation (Signed)
Anesthesia Post Note  Patient: William Ayers  Procedure(s) Performed: COLONOSCOPY WITH PROPOFOL POLYPECTOMY INTESTINAL  Patient location during evaluation: Phase II Anesthesia Type: General Level of consciousness: awake and alert and oriented Pain management: pain level controlled Vital Signs Assessment: post-procedure vital signs reviewed and stable Respiratory status: spontaneous breathing, nonlabored ventilation and respiratory function stable Cardiovascular status: blood pressure returned to baseline and stable Postop Assessment: no apparent nausea or vomiting Anesthetic complications: no  No notable events documented.   Last Vitals:  Vitals:   11/08/22 1248 11/08/22 1250  BP: (!) 72/25 (!) 93/53  Pulse: 74 75  Resp: 18 16  Temp: 36.6 C   SpO2: 96% 97%    Last Pain:  Vitals:   11/08/22 1250  TempSrc:   PainSc: 0-No pain                 Aubrionna Istre C Mattalyn Anderegg

## 2022-11-08 NOTE — Anesthesia Preprocedure Evaluation (Addendum)
Anesthesia Evaluation  Patient identified by MRN, date of birth, ID band Patient awake    Reviewed: Allergy & Precautions, H&P , NPO status , Patient's Chart, lab work & pertinent test results  Airway Mallampati: III  TM Distance: >3 FB Neck ROM: Full    Dental  (+) Dental Advisory Given, Caps   Pulmonary sleep apnea , former smoker   Pulmonary exam normal breath sounds clear to auscultation       Cardiovascular hypertension, Pt. on medications Normal cardiovascular exam Rhythm:Regular Rate:Normal     Neuro/Psych negative neurological ROS  negative psych ROS   GI/Hepatic Neg liver ROS,GERD  Medicated and Controlled,,  Endo/Other  negative endocrine ROS    Renal/GU negative Renal ROS  negative genitourinary   Musculoskeletal negative musculoskeletal ROS (+)    Abdominal   Peds negative pediatric ROS (+)  Hematology negative hematology ROS (+)   Anesthesia Other Findings   Reproductive/Obstetrics negative OB ROS                             Anesthesia Physical Anesthesia Plan  ASA: 2  Anesthesia Plan: General   Post-op Pain Management: Minimal or no pain anticipated   Induction: Intravenous  PONV Risk Score and Plan: 1 and Propofol infusion  Airway Management Planned: Nasal Cannula and Natural Airway  Additional Equipment:   Intra-op Plan:   Post-operative Plan:   Informed Consent: I have reviewed the patients History and Physical, chart, labs and discussed the procedure including the risks, benefits and alternatives for the proposed anesthesia with the patient or authorized representative who has indicated his/her understanding and acceptance.     Dental advisory given  Plan Discussed with: CRNA and Surgeon  Anesthesia Plan Comments:        Anesthesia Quick Evaluation

## 2022-11-08 NOTE — Transfer of Care (Signed)
Immediate Anesthesia Transfer of Care Note  Patient: William Ayers  Procedure(s) Performed: COLONOSCOPY WITH PROPOFOL POLYPECTOMY INTESTINAL  Patient Location: Endoscopy Unit  Anesthesia Type:General  Level of Consciousness: awake  Airway & Oxygen Therapy: Patient Spontanous Breathing  Post-op Assessment: Report given to RN and Post -op Vital signs reviewed and stable  Post vital signs: Reviewed and stable  Last Vitals:  Vitals Value Taken Time  BP 91/53 11/08/22 1248  Temp 36.6 C 11/08/22 1248  Pulse 74 11/08/22 1248  Resp 18 11/08/22 1248  SpO2 96 % 11/08/22 1248    Last Pain:  Vitals:   11/08/22 1248  TempSrc: Axillary  PainSc: 0-No pain      Patients Stated Pain Goal: 5 (11/08/22 1157)  Complications: No notable events documented.

## 2022-11-09 ENCOUNTER — Encounter (INDEPENDENT_AMBULATORY_CARE_PROVIDER_SITE_OTHER): Payer: Self-pay | Admitting: *Deleted

## 2022-11-09 LAB — SURGICAL PATHOLOGY

## 2022-11-10 ENCOUNTER — Encounter (INDEPENDENT_AMBULATORY_CARE_PROVIDER_SITE_OTHER): Payer: Self-pay | Admitting: *Deleted

## 2022-11-13 ENCOUNTER — Encounter (HOSPITAL_COMMUNITY): Payer: Self-pay | Admitting: Gastroenterology

## 2022-11-13 ENCOUNTER — Ambulatory Visit (INDEPENDENT_AMBULATORY_CARE_PROVIDER_SITE_OTHER): Payer: Medicare Other | Admitting: Gastroenterology

## 2023-10-18 ENCOUNTER — Ambulatory Visit (INDEPENDENT_AMBULATORY_CARE_PROVIDER_SITE_OTHER): Payer: PRIVATE HEALTH INSURANCE | Admitting: Gastroenterology

## 2023-10-18 ENCOUNTER — Encounter (INDEPENDENT_AMBULATORY_CARE_PROVIDER_SITE_OTHER): Payer: Self-pay | Admitting: Gastroenterology

## 2023-10-18 VITALS — BP 121/81 | HR 71 | Temp 97.8°F | Ht 68.0 in | Wt 187.2 lb

## 2023-10-18 DIAGNOSIS — K219 Gastro-esophageal reflux disease without esophagitis: Secondary | ICD-10-CM | POA: Diagnosis not present

## 2023-10-18 DIAGNOSIS — R109 Unspecified abdominal pain: Secondary | ICD-10-CM

## 2023-10-18 DIAGNOSIS — R110 Nausea: Secondary | ICD-10-CM | POA: Diagnosis not present

## 2023-10-18 MED ORDER — ONDANSETRON 4 MG PO TBDP
4.0000 mg | ORAL_TABLET | Freq: Three times a day (TID) | ORAL | 0 refills | Status: AC | PRN
Start: 2023-10-18 — End: ?

## 2023-10-18 NOTE — Progress Notes (Signed)
 Katrinka Blazing, M.D. Gastroenterology & Hepatology [facility] [address] [place], [place] [address]  Primary Care Physician: Exie Parody, MD [address] [place] [address]  I will communicate my assessment and recommendations to the referring MD via EMR.  Problems: Persistent nausea GERD  History of Present Illness: William Ayers is a 73 y.o. 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  Patient reports that 5 weeks ago he presented new onset nausea without vomiting. He states he presented continuous nausea for 3 weeks straight with some abdominal pain diffusely which was mild.  He reports that he went to [facility] ER and had a CT scan that showed some gallstones and he was advised to follow with general surgeon and [place].  He reports seeing the general surgeon who did not think the symptoms were related to gallstones.  Reports that for the last 2 weeks, nausea is better but still present. Severity is milder, and is present usually after eating. Has some mild abdominal pain throughout. He has had some occasional episodes of diarrhea, had one episode of diarrhea in last 10 days. No recent travel or sick contacts. The patient denies having any fever, chills, hematochezia, melena, hematemesis, abdominal distention, abdominal pain,jaundice, pruritus. Has lost 10 lb due to poor oral intake.  Has tried Iberogast two times without too much improvement. Also taking ginger to improve symptoms.  The patient was last seen on 04/21/2021. At that time, the patient was scheduled to undergo EGD and was advised to take omeprazole every day for GERD.  Not having any heartburn while taking omeprazole 40 mg daily.  He wants to cut down on his PPI if possible.  No dysphagia or odynophagia.  Last EGD: 04/2021 - 2 cm hiatal hernia. - No endoscopic esophageal abnormality to explain  patient' s dysphagia. Esophagus dilated. Dilated. - Multiple fundic gland polyps. - Mucosal nodule found in the duodenum. Biopsied.  Pathology showed focal peptic duodenitis but no other alterations.   Last Colonoscopy: 10/2022 - One 2 mm polyp in the ascending colon, removed with a cold snare. Resected and retrieved. - Non- bleeding internal hemorrhoids.  Pathology showed 1 tubular adenoma, repeat colonoscopy in 5 years   Past Medical History: Past Medical History:  Diagnosis Date   Back pain    GERD (gastroesophageal reflux disease)    Hypertension    Sleep apnea     Past Surgical History: Past Surgical History:  Procedure Laterality Date   COLONOSCOPY     X 3   COLONOSCOPY N/A 10/07/2015   Procedure: COLONOSCOPY;  Surgeon: Malissa Hippo, MD;  [facility];  Service: Endoscopy;  Laterality: N/A;  730   COLONOSCOPY WITH PROPOFOL N/A 11/08/2022   Procedure: COLONOSCOPY WITH PROPOFOL;  Surgeon: Dolores Frame, MD;  [facility];  Service: Gastroenterology;  Laterality: N/A;  900am, asa 1-2   ESOPHAGEAL DILATION N/A 05/10/2021   Procedure: ESOPHAGEAL DILATION;  Surgeon: Dolores Frame, MD;  [facility];  Service: Gastroenterology;  Laterality: N/A;   ESOPHAGOGASTRODUODENOSCOPY (EGD) WITH PROPOFOL N/A 05/10/2021   Procedure: ESOPHAGOGASTRODUODENOSCOPY (EGD) WITH PROPOFOL;  Surgeon: Dolores Frame, MD;  [facility];  Service: Gastroenterology;  Laterality: N/A;  9:50   Left inguinal repair      20 yrs or more    LUMBAR DISC SURGERY     OPEN REDUCTION INTERNAL FIXATION (ORIF) SCAPHOID WITH  ILIAC CREST BONE GRAFT Left 01/03/2016   Procedure: OPEN TREATMENT OF LEFT  SCAPHOID NONUNION WITH ILIAC CREST BONE GRAFT;  Surgeon: Mack Hook, MD;  Location: [facility];  Service: Orthopedics;  Laterality: Left;  GENERAL ANESTHESIA WITH PRE-OP BLOCK   POLYPECTOMY  05/10/2021   Procedure: POLYPECTOMY;   Surgeon: Dolores Frame, MD;  [facility];  Service: Gastroenterology;;   POLYPECTOMY  11/08/2022   Procedure: POLYPECTOMY INTESTINAL;  Surgeon: Dolores Frame, MD;  [facility];  Service: Gastroenterology;;   ROTATOR CUFF REPAIR     both shoulders. Left in April 2015, Rt in June 2016.   WRIST FRACTURE SURGERY Left     Family History:History reviewed. No pertinent family history.  Social History: Social History   Tobacco Use  Smoking Status Former   Current packs/day: 0.25   Average packs/day: 0.3 packs/day for 4.0 years (1.0 ttl pk-yrs)   Types: Cigarettes  Smokeless Tobacco Former  Tobacco Comments   quit 40 yrs ago   Social History   Substance and Sexual Activity  Alcohol Use No   Alcohol/week: 0.0 standard drinks of alcohol   Social History   Substance and Sexual Activity  Drug Use No    Allergies: Allergies  Allergen Reactions   Alpha-Gal Itching    Medications: Current Outpatient Medications  Medication Sig Dispense Refill   acetaminophen (TYLENOL) 500 MG tablet Take 1,000 mg by mouth at bedtime.     meloxicam (MOBIC) 15 MG tablet Take 15 mg by mouth daily.     Multiple Vitamin (MULTIVITAMIN) tablet Take 1 tablet by mouth daily.     OVER THE COUNTER MEDICATION VIT B complex once day D3 daily Multi vit daily Probiotic daily     omeprazole (PRILOSEC) 40 MG capsule Take 40 mg by mouth daily.     No current facility-administered medications for this visit.    Review of Systems: GENERAL: negative for malaise, night sweats HEENT: No changes in hearing or vision, no nose bleeds or other nasal problems. NECK: Negative for lumps, goiter, pain and significant neck swelling RESPIRATORY: Negative for cough, wheezing CARDIOVASCULAR: Negative for chest pain, leg swelling, palpitations, orthopnea GI: SEE HPI MUSCULOSKELETAL: Negative for joint pain or swelling, back pain, and muscle pain. SKIN: Negative for lesions,  rash PSYCH: Negative for sleep disturbance, mood disorder and recent psychosocial stressors. HEMATOLOGY Negative for prolonged bleeding, bruising easily, and swollen nodes. ENDOCRINE: Negative for cold or heat intolerance, polyuria, polydipsia and goiter. NEURO: negative for tremor, gait imbalance, syncope and seizures. The remainder of the review of systems is noncontributory.   Physical Exam: BP 121/81 (BP Location: Left Arm, Patient Position: Sitting, Cuff Size: Normal)   Pulse 71   Temp 97.8 F (36.6 C) (Temporal)   Ht 5\' 8"  (1.727 m)   Wt 187 lb 3.2 oz (84.9 kg)   BMI 28.46 kg/m  GENERAL: The patient is AO x3, in no acute distress. HEENT: Head is normocephalic and atraumatic. EOMI are intact. Mouth is well hydrated and without lesions. NECK: Supple. No masses LUNGS: Clear to auscultation. No presence of rhonchi/wheezing/rales. Adequate chest expansion HEART: RRR, normal s1 and s2. ABDOMEN: Soft, nontender, no guarding, no peritoneal signs, and nondistended. BS +. No masses. EXTREMITIES: Without any cyanosis, clubbing, rash, lesions or edema. NEUROLOGIC: AOx3, no focal motor deficit. SKIN: no jaundice, no rashes  Imaging/Labs: as above  I personally reviewed and interpreted the available labs, imaging and endoscopic files.  Impression and Plan: William Ayers is  a 73 y.o. 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 Patient reports having new onset of gastrointestinal symptoms, primarily driven by nausea. His abdominal pain has somewhat improved, but nausea is still present. I asked the patient to request a copy of his imaging so it can be reviewed with our radiology department. As he has had persistent nausea, would evaluate further with EGD. I will send a prescription of Zofran for now for symptom control.  In terms of his GERD, seems to be better controlled. He would like to decrease his PPI as much  as possible. Can try decreasing the doage to 20 mg daily for now.  -Schedule EGD -Start Zofran 4 mg q8h as needed for nausea -Try taking Prilosec 20 mg over-the-counter pills daily, can stop omeprazole 40 mg for now -Can take ginger or Iberogast as needed for nausea  All questions were answered.      Katrinka Blazing, MD Gastroenterology and Hepatology [facility] Gastroenterology

## 2023-10-18 NOTE — Patient Instructions (Addendum)
 Schedule EGD Start Zofran 4 mg q8h as needed for nausea Try taking Prilosec 20 mg over-the-counter pills daily, can stop omeprazole 40 mg for now Can take ginger or  Iberogast as needed for nausea

## 2023-10-18 NOTE — H&P (View-Only) (Signed)
 Katrinka Blazing, M.D. Gastroenterology & Hepatology [facility] [address] [place], [place] [address]  Primary Care Physician: Exie Parody, MD [address] [place] [address]  I will communicate my assessment and recommendations to the referring MD via EMR.  Problems: Persistent nausea GERD  History of Present Illness: William Ayers is a 73 y.o. 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  Patient reports that 5 weeks ago he presented new onset nausea without vomiting. He states he presented continuous nausea for 3 weeks straight with some abdominal pain diffusely which was mild.  He reports that he went to [facility] ER and had a CT scan that showed some gallstones and he was advised to follow with general surgeon and [place].  He reports seeing the general surgeon who did not think the symptoms were related to gallstones.  Reports that for the last 2 weeks, nausea is better but still present. Severity is milder, and is present usually after eating. Has some mild abdominal pain throughout. He has had some occasional episodes of diarrhea, had one episode of diarrhea in last 10 days. No recent travel or sick contacts. The patient denies having any fever, chills, hematochezia, melena, hematemesis, abdominal distention, abdominal pain,jaundice, pruritus. Has lost 10 lb due to poor oral intake.  Has tried Iberogast two times without too much improvement. Also taking ginger to improve symptoms.  The patient was last seen on 04/21/2021. At that time, the patient was scheduled to undergo EGD and was advised to take omeprazole every day for GERD.  Not having any heartburn while taking omeprazole 40 mg daily.  He wants to cut down on his PPI if possible.  No dysphagia or odynophagia.  Last EGD: 04/2021 - 2 cm hiatal hernia. - No endoscopic esophageal abnormality to explain  patient' s dysphagia. Esophagus dilated. Dilated. - Multiple fundic gland polyps. - Mucosal nodule found in the duodenum. Biopsied.  Pathology showed focal peptic duodenitis but no other alterations.   Last Colonoscopy: 10/2022 - One 2 mm polyp in the ascending colon, removed with a cold snare. Resected and retrieved. - Non- bleeding internal hemorrhoids.  Pathology showed 1 tubular adenoma, repeat colonoscopy in 5 years   Past Medical History: Past Medical History:  Diagnosis Date   Back pain    GERD (gastroesophageal reflux disease)    Hypertension    Sleep apnea     Past Surgical History: Past Surgical History:  Procedure Laterality Date   COLONOSCOPY     X 3   COLONOSCOPY N/A 10/07/2015   Procedure: COLONOSCOPY;  Surgeon: Malissa Hippo, MD;  [facility];  Service: Endoscopy;  Laterality: N/A;  730   COLONOSCOPY WITH PROPOFOL N/A 11/08/2022   Procedure: COLONOSCOPY WITH PROPOFOL;  Surgeon: Dolores Frame, MD;  [facility];  Service: Gastroenterology;  Laterality: N/A;  900am, asa 1-2   ESOPHAGEAL DILATION N/A 05/10/2021   Procedure: ESOPHAGEAL DILATION;  Surgeon: Dolores Frame, MD;  [facility];  Service: Gastroenterology;  Laterality: N/A;   ESOPHAGOGASTRODUODENOSCOPY (EGD) WITH PROPOFOL N/A 05/10/2021   Procedure: ESOPHAGOGASTRODUODENOSCOPY (EGD) WITH PROPOFOL;  Surgeon: Dolores Frame, MD;  [facility];  Service: Gastroenterology;  Laterality: N/A;  9:50   Left inguinal repair      20 yrs or more    LUMBAR DISC SURGERY     OPEN REDUCTION INTERNAL FIXATION (ORIF) SCAPHOID WITH  ILIAC CREST BONE GRAFT Left 01/03/2016   Procedure: OPEN TREATMENT OF LEFT  SCAPHOID NONUNION WITH ILIAC CREST BONE GRAFT;  Surgeon: Mack Hook, MD;  Location: [facility];  Service: Orthopedics;  Laterality: Left;  GENERAL ANESTHESIA WITH PRE-OP BLOCK   POLYPECTOMY  05/10/2021   Procedure: POLYPECTOMY;   Surgeon: Dolores Frame, MD;  [facility];  Service: Gastroenterology;;   POLYPECTOMY  11/08/2022   Procedure: POLYPECTOMY INTESTINAL;  Surgeon: Dolores Frame, MD;  [facility];  Service: Gastroenterology;;   ROTATOR CUFF REPAIR     both shoulders. Left in April 2015, Rt in June 2016.   WRIST FRACTURE SURGERY Left     Family History:History reviewed. No pertinent family history.  Social History: Social History   Tobacco Use  Smoking Status Former   Current packs/day: 0.25   Average packs/day: 0.3 packs/day for 4.0 years (1.0 ttl pk-yrs)   Types: Cigarettes  Smokeless Tobacco Former  Tobacco Comments   quit 40 yrs ago   Social History   Substance and Sexual Activity  Alcohol Use No   Alcohol/week: 0.0 standard drinks of alcohol   Social History   Substance and Sexual Activity  Drug Use No    Allergies: Allergies  Allergen Reactions   Alpha-Gal Itching    Medications: Current Outpatient Medications  Medication Sig Dispense Refill   acetaminophen (TYLENOL) 500 MG tablet Take 1,000 mg by mouth at bedtime.     meloxicam (MOBIC) 15 MG tablet Take 15 mg by mouth daily.     Multiple Vitamin (MULTIVITAMIN) tablet Take 1 tablet by mouth daily.     OVER THE COUNTER MEDICATION VIT B complex once day D3 daily Multi vit daily Probiotic daily     omeprazole (PRILOSEC) 40 MG capsule Take 40 mg by mouth daily.     No current facility-administered medications for this visit.    Review of Systems: GENERAL: negative for malaise, night sweats HEENT: No changes in hearing or vision, no nose bleeds or other nasal problems. NECK: Negative for lumps, goiter, pain and significant neck swelling RESPIRATORY: Negative for cough, wheezing CARDIOVASCULAR: Negative for chest pain, leg swelling, palpitations, orthopnea GI: SEE HPI MUSCULOSKELETAL: Negative for joint pain or swelling, back pain, and muscle pain. SKIN: Negative for lesions,  rash PSYCH: Negative for sleep disturbance, mood disorder and recent psychosocial stressors. HEMATOLOGY Negative for prolonged bleeding, bruising easily, and swollen nodes. ENDOCRINE: Negative for cold or heat intolerance, polyuria, polydipsia and goiter. NEURO: negative for tremor, gait imbalance, syncope and seizures. The remainder of the review of systems is noncontributory.   Physical Exam: BP 121/81 (BP Location: Left Arm, Patient Position: Sitting, Cuff Size: Normal)   Pulse 71   Temp 97.8 F (36.6 C) (Temporal)   Ht 5\' 8"  (1.727 m)   Wt 187 lb 3.2 oz (84.9 kg)   BMI 28.46 kg/m  GENERAL: The patient is AO x3, in no acute distress. HEENT: Head is normocephalic and atraumatic. EOMI are intact. Mouth is well hydrated and without lesions. NECK: Supple. No masses LUNGS: Clear to auscultation. No presence of rhonchi/wheezing/rales. Adequate chest expansion HEART: RRR, normal s1 and s2. ABDOMEN: Soft, nontender, no guarding, no peritoneal signs, and nondistended. BS +. No masses. EXTREMITIES: Without any cyanosis, clubbing, rash, lesions or edema. NEUROLOGIC: AOx3, no focal motor deficit. SKIN: no jaundice, no rashes  Imaging/Labs: as above  I personally reviewed and interpreted the available labs, imaging and endoscopic files.  Impression and Plan: William Ayers is  a 73 y.o. 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 Patient reports having new onset of gastrointestinal symptoms, primarily driven by nausea. His abdominal pain has somewhat improved, but nausea is still present. I asked the patient to request a copy of his imaging so it can be reviewed with our radiology department. As he has had persistent nausea, would evaluate further with EGD. I will send a prescription of Zofran for now for symptom control.  In terms of his GERD, seems to be better controlled. He would like to decrease his PPI as much  as possible. Can try decreasing the doage to 20 mg daily for now.  -Schedule EGD -Start Zofran 4 mg q8h as needed for nausea -Try taking Prilosec 20 mg over-the-counter pills daily, can stop omeprazole 40 mg for now -Can take ginger or Iberogast as needed for nausea  All questions were answered.      Katrinka Blazing, MD Gastroenterology and Hepatology [facility] Gastroenterology

## 2023-10-24 ENCOUNTER — Encounter (HOSPITAL_COMMUNITY): Payer: Self-pay | Admitting: Gastroenterology

## 2023-10-24 ENCOUNTER — Encounter (HOSPITAL_COMMUNITY)
Admission: RE | Disposition: A | Discharge status: Home / Self Care | Payer: Self-pay | Source: Home / Self Care | Attending: Gastroenterology

## 2023-10-24 ENCOUNTER — Other Ambulatory Visit: Payer: Self-pay

## 2023-10-24 ENCOUNTER — Ambulatory Visit (HOSPITAL_COMMUNITY)
Admission: RE | Admit: 2023-10-24 | Discharge: 2023-10-24 | Disposition: A | Discharge status: Home / Self Care | Attending: Gastroenterology | Admitting: Gastroenterology

## 2023-10-24 ENCOUNTER — Ambulatory Visit (HOSPITAL_COMMUNITY): Admitting: Anesthesiology

## 2023-10-24 DIAGNOSIS — Z79899 Other long term (current) drug therapy: Secondary | ICD-10-CM | POA: Diagnosis not present

## 2023-10-24 DIAGNOSIS — I10 Essential (primary) hypertension: Secondary | ICD-10-CM | POA: Diagnosis not present

## 2023-10-24 DIAGNOSIS — K222 Esophageal obstruction: Secondary | ICD-10-CM | POA: Diagnosis not present

## 2023-10-24 DIAGNOSIS — Z791 Long term (current) use of non-steroidal anti-inflammatories (NSAID): Secondary | ICD-10-CM | POA: Diagnosis not present

## 2023-10-24 DIAGNOSIS — K449 Diaphragmatic hernia without obstruction or gangrene: Secondary | ICD-10-CM | POA: Diagnosis not present

## 2023-10-24 DIAGNOSIS — Z87891 Personal history of nicotine dependence: Secondary | ICD-10-CM | POA: Diagnosis not present

## 2023-10-24 DIAGNOSIS — R1310 Dysphagia, unspecified: Secondary | ICD-10-CM | POA: Insufficient documentation

## 2023-10-24 DIAGNOSIS — R110 Nausea: Secondary | ICD-10-CM | POA: Insufficient documentation

## 2023-10-24 DIAGNOSIS — G4730 Sleep apnea, unspecified: Secondary | ICD-10-CM | POA: Diagnosis not present

## 2023-10-24 DIAGNOSIS — K2289 Other specified disease of esophagus: Secondary | ICD-10-CM

## 2023-10-24 DIAGNOSIS — K219 Gastro-esophageal reflux disease without esophagitis: Secondary | ICD-10-CM | POA: Insufficient documentation

## 2023-10-24 HISTORY — PX: ESOPHAGOGASTRODUODENOSCOPY: SHX5428

## 2023-10-24 SURGERY — EGD (ESOPHAGOGASTRODUODENOSCOPY)
Anesthesia: General

## 2023-10-24 MED ORDER — PROPOFOL 10 MG/ML IV BOLUS
INTRAVENOUS | Status: DC | PRN
  Administered 2023-10-24 (×3): 50 mg via INTRAVENOUS

## 2023-10-24 MED ORDER — LACTATED RINGERS IV SOLN
INTRAVENOUS | Status: DC
Start: 2023-10-24 — End: 2023-10-24

## 2023-10-24 MED ORDER — LIDOCAINE HCL (CARDIAC) PF 100 MG/5ML IV SOSY
PREFILLED_SYRINGE | INTRAVENOUS | Status: DC | PRN
  Administered 2023-10-24: 80 mg via INTRATRACHEAL

## 2023-10-24 MED ORDER — PROPOFOL 500 MG/50ML IV EMUL
INTRAVENOUS | Status: DC | PRN
  Administered 2023-10-24: 100 ug/kg/min via INTRAVENOUS

## 2023-10-24 NOTE — Transfer of Care (Signed)
 Immediate Anesthesia Transfer of Care Note  Patient: William Ayers  Procedure(s) Performed: EGD (ESOPHAGOGASTRODUODENOSCOPY) Balloon dilation wire-guided  Patient Location: PACU  Anesthesia Type:MAC  Level of Consciousness: awake, alert , and oriented  Airway & Oxygen Therapy: Patient Spontanous Breathing and Patient connected to nasal cannula oxygen  Post-op Assessment: Report given to RN and Post -op Vital signs reviewed and stable  Post vital signs: Reviewed and stable  Last Vitals:  Vitals Value Taken Time  BP 113/72 10/24/23 1157  Temp 36.6 C 10/24/23 1157  Pulse 56 10/24/23 1157  Resp 16 10/24/23 1157  SpO2 95 % 10/24/23 1157    Last Pain:  Vitals:   10/24/23 1157  TempSrc: Oral  PainSc: 0-No pain      Patients Stated Pain Goal: 6 (10/24/23 1046)  Complications: No notable events documented.

## 2023-10-24 NOTE — Op Note (Signed)
 [facility] Patient Name: William Ayers Procedure Date: 10/24/2023 11:22 AM MRN: [id number] Date of Birth: [birth year] Attending MD: Katrinka Blazing , , [id number] CSN: [id number] Age: 73 Admit Type: Outpatient Procedure:                Upper GI endoscopy Indications:              Dysphagia, Nausea Providers:                Katrinka Blazing, Sheran Fava, Dyann Ruddle,                            Italy Wilson Referring MD:              Medicines:                Monitored Anesthesia Care Complications:            No immediate complications. Estimated Blood Loss:     Estimated blood loss: none. Procedure:                Pre-Anesthesia Assessment:                           - Prior to the procedure, a History and Physical                            was performed, and patient medications, allergies                            and sensitivities were reviewed. The patient's                            tolerance of previous anesthesia was reviewed.                           - The risks and benefits of the procedure and the                            sedation options and risks were discussed with the                            patient. All questions were answered and informed                            consent was obtained.                           - ASA Grade Assessment: II - A patient with mild                            systemic disease.                           After obtaining informed consent, the endoscope was                            passed under direct vision. Throughout the  procedure, the patient's blood pressure, pulse, and                            oxygen saturations were monitored continuously. The                            [id number]-H190 ([id number]) scope was introduced through the                            mouth, and advanced to the second part of duodenum.                            The upper GI endoscopy was accomplished without                             difficulty. The patient tolerated the procedure                            well. Scope In: 11:42:42 AM Scope Out: 11:50:26 AM Total Procedure Duration: 0 hours 7 minutes 44 seconds  Findings:      One benign-appearing, intrinsic moderate (circumferential scarring or       stenosis; an endoscope may pass) stenosis was found at the       gastroesophageal junction. This stenosis measured 1.5 cm (inner       diameter) x less than one cm (in length). The stenosis was traversed. A       cold forceps was used to disrupt the rim of the ring. A TTS dilator was       passed through the scope. Dilation with a 15-16.5-18 mm balloon dilator       was performed to 18 mm. The dilation site was examined following       endoscope reinsertion and showed mild mucosal disruption.      A 2 cm hiatal hernia was present.      The entire examined stomach was normal. Biopsies were taken with a cold       forceps for Helicobacter pylori testing.      The examined duodenum was normal. Impression:               - Benign-appearing esophageal stenosis. Dilated.                           - 2 cm hiatal hernia.                           - Normal stomach. Biopsied.                           - Normal examined duodenum. Moderate Sedation:      Per Anesthesia Care Recommendation:           - Discharge patient to home (ambulatory).                           - Resume previous diet.                           - Await  pathology results.                           - Continue present medications. Procedure Code(s):        --- Professional ---                           [phone], Esophagogastroduodenoscopy, flexible,                            transoral; with transendoscopic balloon dilation of                            esophagus (less than 30 mm diameter)                           43239, 59, Esophagogastroduodenoscopy, flexible,                            transoral; with biopsy, single or multiple Diagnosis Code(s):         --- Professional ---                           K22.2, Esophageal obstruction                           K44.9, Diaphragmatic hernia without obstruction or                            gangrene                           R13.10, Dysphagia, unspecified                           R11.0, Nausea CPT copyright 2022 American Medical Association. All rights reserved. The codes documented in this report are preliminary and upon coder review may  be revised to meet current compliance requirements. Katrinka Blazing, MD Katrinka Blazing,  10/24/2023 11:58:30 AM This report has been signed electronically. Number of Addenda: 0

## 2023-10-24 NOTE — Interval H&P Note (Signed)
 History and Physical Interval Note:  10/24/2023 10:47 AM  William Ayers  has presented today for surgery, with the diagnosis of NAUSEA.  The various methods of treatment have been discussed with the patient and family. After consideration of risks, benefits and other options for treatment, the patient has consented to  Procedure(s) with comments: EGD (ESOPHAGOGASTRODUODENOSCOPY) (N/A) - 1:00PM;ASA 1 as a surgical intervention.  The patient's history has been reviewed, patient examined, no change in status, stable for surgery.  I have reviewed the patient's chart and labs.  Questions were answered to the patient's satisfaction.     [name]

## 2023-10-24 NOTE — Anesthesia Preprocedure Evaluation (Signed)
 Anesthesia Evaluation  Patient identified by MRN, date of birth, ID band Patient awake    Reviewed: Allergy & Precautions, H&P , NPO status , Patient's Chart, lab work & pertinent test results, reviewed documented beta blocker date and time   Airway Mallampati: II  TM Distance: >3 FB Neck ROM: full    Dental no notable dental hx.    Pulmonary neg pulmonary ROS, sleep apnea , former smoker   Pulmonary exam normal breath sounds clear to auscultation       Cardiovascular Exercise Tolerance: Good hypertension, negative cardio ROS  Rhythm:regular Rate:Normal     Neuro/Psych negative neurological ROS  negative psych ROS   GI/Hepatic negative GI ROS, Neg liver ROS,GERD  ,,  Endo/Other  negative endocrine ROS    Renal/GU negative Renal ROS  negative genitourinary   Musculoskeletal   Abdominal   Peds  Hematology negative hematology ROS (+)   Anesthesia Other Findings   Reproductive/Obstetrics negative OB ROS                             Anesthesia Physical Anesthesia Plan  ASA: 2  Anesthesia Plan: General   Post-op Pain Management:    Induction:   PONV Risk Score and Plan: Propofol infusion  Airway Management Planned:   Additional Equipment:   Intra-op Plan:   Post-operative Plan:   Informed Consent: I have reviewed the patients History and Physical, chart, labs and discussed the procedure including the risks, benefits and alternatives for the proposed anesthesia with the patient or authorized representative who has indicated his/her understanding and acceptance.     Dental Advisory Given  Plan Discussed with: CRNA  Anesthesia Plan Comments:        Anesthesia Quick Evaluation

## 2023-10-24 NOTE — Discharge Instructions (Signed)
You are being discharged to home.  ?Resume your previous diet.  ?We are waiting for your pathology results.  ?Continue your present medications.  ?

## 2023-10-25 ENCOUNTER — Encounter (HOSPITAL_COMMUNITY): Payer: Self-pay | Admitting: Gastroenterology

## 2023-10-25 LAB — SURGICAL PATHOLOGY

## 2023-10-25 NOTE — Anesthesia Postprocedure Evaluation (Signed)
 Anesthesia Post Note  Patient: William Ayers  Procedure(s) Performed: EGD (ESOPHAGOGASTRODUODENOSCOPY) Balloon dilation wire-guided  Patient location during evaluation: Phase II Anesthesia Type: General Level of consciousness: awake Pain management: pain level controlled Vital Signs Assessment: post-procedure vital signs reviewed and stable Respiratory status: spontaneous breathing and respiratory function stable Cardiovascular status: blood pressure returned to baseline and stable Postop Assessment: no headache and no apparent nausea or vomiting Anesthetic complications: no Comments: Late entry   No notable events documented.   Last Vitals:  Vitals:   10/24/23 1046 10/24/23 1157  BP: 114/82 113/72  Pulse: (!) 54 (!) 56  Resp: 16 16  Temp: 36.8 C 36.6 C  SpO2: 98% 95%    Last Pain:  Vitals:   10/24/23 1157  TempSrc: Oral  PainSc: 0-No pain                 Windell Norfolk

## 2023-10-26 ENCOUNTER — Encounter (INDEPENDENT_AMBULATORY_CARE_PROVIDER_SITE_OTHER): Payer: Self-pay | Admitting: *Deleted

## 2023-11-29 ENCOUNTER — Encounter (INDEPENDENT_AMBULATORY_CARE_PROVIDER_SITE_OTHER): Payer: Self-pay | Admitting: Gastroenterology

## 2023-12-13 ENCOUNTER — Other Ambulatory Visit: Payer: Self-pay | Admitting: Orthopedic Surgery

## 2023-12-13 DIAGNOSIS — G8929 Other chronic pain: Secondary | ICD-10-CM

## 2023-12-17 ENCOUNTER — Other Ambulatory Visit: Payer: Self-pay | Admitting: Orthopedic Surgery

## 2023-12-17 DIAGNOSIS — G8929 Other chronic pain: Secondary | ICD-10-CM

## 2023-12-21 ENCOUNTER — Ambulatory Visit
Admission: RE | Admit: 2023-12-21 | Discharge: 2023-12-21 | Disposition: A | Discharge status: Home / Self Care | Payer: PRIVATE HEALTH INSURANCE | Source: Ambulatory Visit | Attending: Orthopedic Surgery | Admitting: Orthopedic Surgery

## 2023-12-21 DIAGNOSIS — G8929 Other chronic pain: Secondary | ICD-10-CM

## 2024-05-21 ENCOUNTER — Encounter (INDEPENDENT_AMBULATORY_CARE_PROVIDER_SITE_OTHER): Payer: Self-pay | Admitting: Gastroenterology

## 2024-07-14 ENCOUNTER — Telehealth: Payer: Self-pay | Admitting: Gastroenterology

## 2024-07-14 NOTE — Telephone Encounter (Signed)
 Good morning Dr. Avram,   I received a call from this patient requesting to transfer his care over to you. Patient was last seen in [facility] on April 03 rd of 2025. Patient stated that his provider in [facility] is longer there. Patient would like to be seen by you here at our practice. Patient records are able to be view in [facility]. Would you please advise on scheduling this patient.    Thank you.

## 2024-07-17 NOTE — Telephone Encounter (Signed)
 I checked and other providers in [place] have capacity so I ask that he schedule with one of them due to size of my practice

## 2024-07-18 NOTE — Telephone Encounter (Signed)
 Patient was advised of your recommendation.

## 2024-08-13 ENCOUNTER — Ambulatory Visit (INDEPENDENT_AMBULATORY_CARE_PROVIDER_SITE_OTHER): Payer: PRIVATE HEALTH INSURANCE | Admitting: Gastroenterology
